# Patient Record
Sex: Male | Born: 1995 | Race: White | Hispanic: No | Marital: Single | Smoking: Never smoker
Health system: Southern US, Community
[De-identification: ages and names within clinical notes are randomized; demographics above are authoritative.]

## PROBLEM LIST (undated history)

## (undated) VITALS — BP 116/75 | HR 92 | Temp 99.5°F | Resp 18 | Ht 70.0 in | Wt 211.0 lb

## (undated) DIAGNOSIS — S0990XA Unspecified injury of head, initial encounter: Secondary | ICD-10-CM

## (undated) DIAGNOSIS — F69 Unspecified disorder of adult personality and behavior: Secondary | ICD-10-CM

## (undated) DIAGNOSIS — F99 Mental disorder, not otherwise specified: Secondary | ICD-10-CM

## (undated) DIAGNOSIS — A86 Unspecified viral encephalitis: Secondary | ICD-10-CM

## (undated) HISTORY — DX: Unspecified viral encephalitis: A86

## (undated) HISTORY — PX: FRACTURE SURGERY: SHX138

## (undated) HISTORY — DX: Unspecified injury of head, initial encounter: S09.90XA

---
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
## 2015-01-15 ENCOUNTER — Other Ambulatory Visit (HOSPITAL_COMMUNITY): Payer: Self-pay | Admitting: Sports Medicine

## 2015-01-15 DIAGNOSIS — M25521 Pain in right elbow: Secondary | ICD-10-CM

## 2015-01-26 ENCOUNTER — Ambulatory Visit (HOSPITAL_COMMUNITY)
Admission: RE | Admit: 2015-01-26 | Discharge: 2015-01-26 | Disposition: A | Discharge status: Home / Self Care | Payer: 59 | Source: Ambulatory Visit | Attending: Sports Medicine | Admitting: Sports Medicine

## 2015-01-26 DIAGNOSIS — M25521 Pain in right elbow: Secondary | ICD-10-CM

## 2015-01-26 DIAGNOSIS — M79601 Pain in right arm: Secondary | ICD-10-CM | POA: Diagnosis present

## 2015-10-21 ENCOUNTER — Telehealth: Payer: Self-pay | Admitting: Family Medicine

## 2015-10-21 DIAGNOSIS — R569 Unspecified convulsions: Secondary | ICD-10-CM

## 2015-10-21 NOTE — Telephone Encounter (Signed)
Ok to re-establish.  In the meantime, we'll send referral to Neuro for seizures

## 2015-10-21 NOTE — Telephone Encounter (Signed)
Notified Tammy that referral was sent to Neuro. They already have appt scheduled. She is driving and will call back to set up new pt/re-est with Dr. Birdie Riddle

## 2015-10-21 NOTE — Telephone Encounter (Signed)
Pt mom called stating he had a neuro episode yesterday and cannot go to Dr. Gaynell Face (ped neuro) anymore. She called adult neuro and was told pt needs referral. Pt last seen 12/01/2011. Advised mom he would need re-est and have appt. Pt mom is a pt too and they are ok to go to [facility].

## 2015-10-21 NOTE — Telephone Encounter (Signed)
Please schedule pt with tabori and referral was placed today.

## 2015-11-04 ENCOUNTER — Ambulatory Visit (INDEPENDENT_AMBULATORY_CARE_PROVIDER_SITE_OTHER): Payer: 59 | Admitting: Neurology

## 2015-11-04 ENCOUNTER — Encounter: Payer: Self-pay | Admitting: Neurology

## 2015-11-04 VITALS — BP 122/84 | HR 86 | Wt 204.0 lb

## 2015-11-04 DIAGNOSIS — R6889 Other general symptoms and signs: Secondary | ICD-10-CM

## 2015-11-04 DIAGNOSIS — R55 Syncope and collapse: Secondary | ICD-10-CM

## 2015-11-04 DIAGNOSIS — IMO0001 Reserved for inherently not codable concepts without codable children: Secondary | ICD-10-CM

## 2015-11-04 NOTE — Patient Instructions (Addendum)
1. Schedule MRI brain with and without contrast 2. Schedule 1-hour sleep deprived EEG 3. Follow-up in 1 month, call for any changes  You have been scheduled @ [facility] for MRI on 11/09/15.  Please arrive @ 2:00 pm.   [facility], [place] [address]      [phone]
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
## 2015-11-07 DIAGNOSIS — R55 Syncope and collapse: Secondary | ICD-10-CM | POA: Insufficient documentation

## 2015-11-07 DIAGNOSIS — IMO0001 Reserved for inherently not codable concepts without codable children: Secondary | ICD-10-CM | POA: Insufficient documentation

## 2015-11-07 DIAGNOSIS — R6889 Other general symptoms and signs: Secondary | ICD-10-CM

## 2015-11-09 DIAGNOSIS — R42 Dizziness and giddiness: Secondary | ICD-10-CM | POA: Diagnosis not present

## 2015-11-09 DIAGNOSIS — R2689 Other abnormalities of gait and mobility: Secondary | ICD-10-CM | POA: Diagnosis not present

## 2015-11-09 DIAGNOSIS — H538 Other visual disturbances: Secondary | ICD-10-CM | POA: Diagnosis not present

## 2015-11-11 ENCOUNTER — Telehealth: Payer: Self-pay | Admitting: Family Medicine

## 2015-11-11 NOTE — Telephone Encounter (Signed)
-----   Message from Cameron Sprang, MD sent at 11/11/2015 10:56 AM EST ----- Regarding: MRI brain results Pls let patient/mom know I reviewed MRI brain and it is normal, no evidence of tumor, stroke, or bleed. Thanks

## 2015-11-11 NOTE — Telephone Encounter (Signed)
Patient's mother/Tammy was notified of results.

## 2015-11-19 ENCOUNTER — Other Ambulatory Visit: Payer: 59

## 2015-12-03 ENCOUNTER — Ambulatory Visit (INDEPENDENT_AMBULATORY_CARE_PROVIDER_SITE_OTHER): Payer: 59 | Admitting: Neurology

## 2015-12-03 DIAGNOSIS — R55 Syncope and collapse: Secondary | ICD-10-CM | POA: Diagnosis not present

## 2015-12-09 ENCOUNTER — Ambulatory Visit (INDEPENDENT_AMBULATORY_CARE_PROVIDER_SITE_OTHER): Payer: 59 | Admitting: Neurology

## 2015-12-09 ENCOUNTER — Encounter: Payer: Self-pay | Admitting: Family Medicine

## 2015-12-09 ENCOUNTER — Ambulatory Visit (INDEPENDENT_AMBULATORY_CARE_PROVIDER_SITE_OTHER): Payer: 59 | Admitting: Family Medicine

## 2015-12-09 ENCOUNTER — Encounter: Payer: Self-pay | Admitting: Neurology

## 2015-12-09 VITALS — BP 114/82 | HR 79 | Wt 203.0 lb

## 2015-12-09 VITALS — BP 118/78 | HR 70 | Temp 97.8°F | Ht 70.0 in | Wt 203.6 lb

## 2015-12-09 DIAGNOSIS — Z0000 Encounter for general adult medical examination without abnormal findings: Secondary | ICD-10-CM | POA: Diagnosis not present

## 2015-12-09 DIAGNOSIS — R55 Syncope and collapse: Secondary | ICD-10-CM | POA: Diagnosis not present

## 2015-12-09 DIAGNOSIS — IMO0001 Reserved for inherently not codable concepts without codable children: Secondary | ICD-10-CM

## 2015-12-09 DIAGNOSIS — R6889 Other general symptoms and signs: Secondary | ICD-10-CM | POA: Diagnosis not present

## 2015-12-09 NOTE — Assessment & Plan Note (Signed)
Pt's PE WNL.  Pt declined flu shot.  Check labs.  Anticipatory guidance provided.

## 2015-12-09 NOTE — Patient Instructions (Signed)
Follow up in 1 year or as needed We'll notify you of your lab results and make any changes if needed Continue to work on healthy diet and regular exercise Call with any questions or concerns If you want to join [place] at the new [place] office, any scheduled appointments will automatically transfer and we will see you at [address] [place] [address] [place], [place] [address] ([place]) Happy Early Rudene Anda!!!

## 2015-12-09 NOTE — Procedures (Signed)
ELECTROENCEPHALOGRAM REPORT  Date of Study: 12/03/2015  Patient's Name: Gregg Crawford MRN: [id number] Date of Birth: 01-May-1996  Referring Provider: Dr. Ellouise Newer  Clinical History: This is a 20 year old man with a history of recurrent episodes of feeling "out of it," blurred vision, inability to function with near-syncopal sensation. After the episodes he would be sluggish to respond but without confusion.  Medications: none  Technical Summary: A multichannel digital 1-hour EEG recording measured by the international 10-20 system with electrodes applied with paste and impedances below 5000 ohms performed in our laboratory with EKG monitoring in an awake and asleep patient.  Hyperventilation and photic stimulation were performed.  The digital EEG was referentially recorded, reformatted, and digitally filtered in a variety of bipolar and referential montages for optimal display.    Description: The patient is awake and asleep during the recording.  During maximal wakefulness, there is a symmetric, medium voltage 11 Hz posterior dominant rhythm that attenuates with eye opening.  The record is symmetric.  During drowsiness and sleep, there is an increase in theta slowing of the background.  Vertex waves and symmetric sleep spindles were seen.  Hyperventilation and photic stimulation did not elicit any abnormalities.  There were no epileptiform discharges or electrographic seizures seen.    EKG lead was unremarkable.  Impression: This 1-hour awake and asleep EEG is normal.    Clinical Correlation: A normal EEG does not exclude a clinical diagnosis of epilepsy.  If further clinical questions remain, prolonged EEG may be helpful.  Clinical correlation is advised.   Ellouise Newer, M.D.

## 2015-12-09 NOTE — Progress Notes (Signed)
   Subjective:    Patient ID: Gregg Crawford, male    DOB: 23-Dec-1995, 20 y.o.   MRN: [id number]  HPI CPE- currently having a neuro w/u for near syncope.  No other concerns today.   Review of Systems Patient reports no vision/hearing changes, anorexia, fever, adenopathy, persistant/recurrent hoarseness, swallowing issues, chest pain, palpitations, edema, persistant/recurrent cough, hemoptysis, dyspnea (rest,exertional, paroxysmal nocturnal), gastrointestinal  bleeding (melena, rectal bleeding), abdominal pain, excessive heart burn, GU symptoms (dysuria, hematuria, voiding/incontinence issues), focal weakness, memory loss, numbness & tingling, skin/hair/nail changes, depression, anxiety, abnormal bruising/bleeding, musculoskeletal symptoms/signs.     Objective:   Physical Exam General Appearance:    Alert, cooperative, no distress, appears stated age  Head:    Normocephalic, without obvious abnormality, atraumatic  Eyes:    PERRL, conjunctiva/corneas clear, EOM's intact, fundi    benign, both eyes       Ears:    Normal TM's and external ear canals, both ears  Nose:   Nares normal, septum midline, mucosa normal, no drainage   or sinus tenderness  Throat:   Lips, mucosa, and tongue normal; teeth and gums normal  Neck:   Supple, symmetrical, trachea midline, no adenopathy;       thyroid:  No enlargement/tenderness/nodules  Back:     Symmetric, no curvature, ROM normal, no CVA tenderness  Lungs:     Clear to auscultation bilaterally, respirations unlabored  Chest wall:    No tenderness or deformity  Heart:    Regular rate and rhythm, S1 and S2 normal, no murmur, rub   or gallop  Abdomen:     Soft, non-tender, bowel sounds active all four quadrants,    no masses, no organomegaly  Genitalia:    Normal male without lesion, masses,discharge or tenderness  Rectal:    Deferred due to young age  Extremities:   Extremities normal, atraumatic, no cyanosis or edema  Pulses:   2+ and symmetric all  extremities  Skin:   Skin color, texture, turgor normal, no rashes or lesions  Lymph nodes:   Cervical, supraclavicular, and axillary nodes normal  Neurologic:   CNII-XII intact. Normal strength, sensation and reflexes      throughout          Assessment & Plan:

## 2015-12-09 NOTE — Patient Instructions (Signed)
1. Schedule 30-day holter monitor 2. If holter monitor normal, we will consider a 24-hour EEG 3. If you lose consciousness, stop driving until 6 months event-free 4. Follow-up in 2 months, call for any changes
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
## 2015-12-10 LAB — CBC WITH DIFFERENTIAL/PLATELET
BASOS PCT: 0.4 % (ref 0.0–3.0)
Basophils Absolute: 0 10*3/uL (ref 0.0–0.1)
EOS PCT: 1.2 % (ref 0.0–5.0)
Eosinophils Absolute: 0.1 10*3/uL (ref 0.0–0.7)
HCT: 49.1 % (ref 39.0–52.0)
Hemoglobin: 16.3 g/dL (ref 13.0–17.0)
LYMPHS ABS: 2.6 10*3/uL (ref 0.7–4.0)
Lymphocytes Relative: 41.8 % (ref 12.0–46.0)
MCHC: 33.2 g/dL (ref 30.0–36.0)
MCV: 91.7 fl (ref 78.0–100.0)
MONOS PCT: 8.7 % (ref 3.0–12.0)
Monocytes Absolute: 0.5 10*3/uL (ref 0.1–1.0)
NEUTROS ABS: 3 10*3/uL (ref 1.4–7.7)
NEUTROS PCT: 47.9 % (ref 43.0–77.0)
PLATELETS: 255 10*3/uL (ref 150.0–400.0)
RBC: 5.36 Mil/uL (ref 4.22–5.81)
RDW: 11.7 % (ref 11.5–14.6)
WBC: 6.3 10*3/uL (ref 4.5–10.5)

## 2015-12-10 LAB — TSH: TSH: 0.95 u[IU]/mL (ref 0.35–5.50)

## 2015-12-10 LAB — BASIC METABOLIC PANEL
BUN: 11 mg/dL (ref 6–23)
CO2: 30 mEq/L (ref 19–32)
Calcium: 9.8 mg/dL (ref 8.4–10.5)
Chloride: 103 mEq/L (ref 96–112)
Creatinine, Ser: 1.14 mg/dL (ref 0.40–1.50)
GFR: 86.19 mL/min (ref >60.00)
GLUCOSE: 72 mg/dL (ref 70–99)
POTASSIUM: 4.3 meq/L (ref 3.5–5.1)
SODIUM: 139 meq/L (ref 135–145)

## 2015-12-10 LAB — HEPATIC FUNCTION PANEL
ALK PHOS: 76 U/L (ref 39–117)
ALT: 20 U/L (ref 0–53)
AST: 22 U/L (ref 0–37)
Albumin: 4.7 g/dL (ref 3.5–5.2)
BILIRUBIN DIRECT: 0.1 mg/dL (ref 0.0–0.3)
Total Bilirubin: 0.6 mg/dL (ref 0.2–1.2)
Total Protein: 7.4 g/dL (ref 6.0–8.3)

## 2015-12-10 LAB — LIPID PANEL
CHOL/HDL RATIO: 3
CHOLESTEROL: 158 mg/dL (ref 0–200)
HDL: 47 mg/dL (ref >39.00)
LDL CALC: 85 mg/dL (ref 0–99)
NonHDL: 110.91
TRIGLYCERIDES: 129 mg/dL (ref 0.0–149.0)
VLDL: 25.8 mg/dL (ref 0.0–40.0)

## 2015-12-11 ENCOUNTER — Encounter: Payer: Self-pay | Admitting: General Practice

## 2015-12-16 ENCOUNTER — Ambulatory Visit: Payer: 59

## 2015-12-16 ENCOUNTER — Encounter: Payer: Self-pay | Admitting: *Deleted

## 2015-12-16 NOTE — Progress Notes (Signed)
Patient ID: Gregg Crawford, male   DOB: 07-17-1996, 20 y.o.   MRN: [id number] Patient enrolled for [employer] to mail a 30 day cardiac event monitor to the home.  We are unable to apply our standard [employer] monitor to patients less than or equal to 58 years of age.  Patients mother requests  [employer] to mail the monitor to her home in order to assure someone will be there to receive it.

## 2015-12-17 ENCOUNTER — Ambulatory Visit (INDEPENDENT_AMBULATORY_CARE_PROVIDER_SITE_OTHER): Payer: 59

## 2015-12-17 DIAGNOSIS — R55 Syncope and collapse: Secondary | ICD-10-CM

## 2015-12-17 DIAGNOSIS — R6889 Other general symptoms and signs: Secondary | ICD-10-CM

## 2016-01-27 ENCOUNTER — Telehealth: Payer: Self-pay | Admitting: Family Medicine

## 2016-01-27 NOTE — Telephone Encounter (Signed)
-----   Message from Cameron Sprang, MD sent at 01/27/2016  1:20 PM EDT ----- Pls let patient/mother know that heart monitor was normal. Thanks

## 2016-01-27 NOTE — Telephone Encounter (Signed)
Called patient. Spoke with patients mother/Gregg Crawford notified her of result. We will cancel patients appt for f/u next week since nothing has changed with patients sxs/no new sxs & work up has been normal. They will call for an appt if anything changes.

## 2016-02-04 ENCOUNTER — Ambulatory Visit: Payer: 59 | Admitting: Neurology

## 2016-02-05 ENCOUNTER — Ambulatory Visit: Payer: 59 | Admitting: Neurology

## 2017-01-06 DIAGNOSIS — S60452A Superficial foreign body of right middle finger, initial encounter: Secondary | ICD-10-CM | POA: Diagnosis not present

## 2017-01-12 DIAGNOSIS — D229 Melanocytic nevi, unspecified: Secondary | ICD-10-CM | POA: Diagnosis not present

## 2017-01-12 DIAGNOSIS — L709 Acne, unspecified: Secondary | ICD-10-CM | POA: Diagnosis not present

## 2017-01-12 DIAGNOSIS — D179 Benign lipomatous neoplasm, unspecified: Secondary | ICD-10-CM | POA: Diagnosis not present

## 2017-04-12 ENCOUNTER — Other Ambulatory Visit (HOSPITAL_COMMUNITY)
Admission: RE | Admit: 2017-04-12 | Discharge: 2017-04-12 | Disposition: A | Discharge status: Home / Self Care | Payer: 59 | Source: Ambulatory Visit | Attending: Family Medicine | Admitting: Family Medicine

## 2017-04-12 ENCOUNTER — Encounter: Payer: Self-pay | Admitting: Family Medicine

## 2017-04-12 ENCOUNTER — Ambulatory Visit (INDEPENDENT_AMBULATORY_CARE_PROVIDER_SITE_OTHER): Payer: 59 | Admitting: Family Medicine

## 2017-04-12 ENCOUNTER — Encounter: Payer: Self-pay | Admitting: General Practice

## 2017-04-12 VITALS — BP 121/81 | HR 90 | Temp 98.0°F | Resp 17 | Ht 70.0 in | Wt 192.2 lb

## 2017-04-12 DIAGNOSIS — Z23 Encounter for immunization: Secondary | ICD-10-CM

## 2017-04-12 DIAGNOSIS — Z0000 Encounter for general adult medical examination without abnormal findings: Secondary | ICD-10-CM

## 2017-04-12 DIAGNOSIS — Z202 Contact with and (suspected) exposure to infections with a predominantly sexual mode of transmission: Secondary | ICD-10-CM | POA: Insufficient documentation

## 2017-04-12 DIAGNOSIS — H539 Unspecified visual disturbance: Secondary | ICD-10-CM | POA: Diagnosis not present

## 2017-04-12 NOTE — Addendum Note (Signed)
Addended by: Katina Dung on: 04/12/2017 08:34 AM   Modules accepted: Orders

## 2017-04-12 NOTE — Progress Notes (Signed)
   Subjective:    Patient ID: Gregg Crawford, male    DOB: [birth year], 21 y.o.   MRN: [id number]  HPI CPE- pt's wants STD testing due to fact that girlfriend cheating.  Due for Tdap.  Pt is down 10 lbs since last year.  Exercising regularly.     Review of Systems Patient reports no vision/hearing changes, anorexia, fever ,adenopathy, persistant/recurrent hoarseness, swallowing issues, chest pain, palpitations, edema, persistant/recurrent cough, hemoptysis, dyspnea (rest,exertional, paroxysmal nocturnal), gastrointestinal  bleeding (melena, rectal bleeding), abdominal pain, excessive heart burn, GU symptoms (dysuria, hematuria, voiding/incontinence issues) syncope, focal weakness, memory loss, numbness & tingling, skin/hair/nail changes, depression, anxiety, abnormal bruising/bleeding, musculoskeletal symptoms/signs.   + visual changes in L eye    Objective:   Physical Exam General Appearance:    Alert, cooperative, no distress, appears stated age  Head:    Normocephalic, without obvious abnormality, atraumatic  Eyes:    PERRL, conjunctiva/corneas clear, EOM's intact, fundi    benign, both eyes       Ears:    Normal TM's and external ear canals, both ears  Nose:   Nares normal, septum midline, mucosa normal, no drainage   or sinus tenderness  Throat:   Lips, mucosa, and tongue normal; teeth and gums normal  Neck:   Supple, symmetrical, trachea midline, no adenopathy;       thyroid:  No enlargement/tenderness/nodules  Back:     Symmetric, no curvature, ROM normal, no CVA tenderness  Lungs:     Clear to auscultation bilaterally, respirations unlabored  Chest wall:    No tenderness or deformity  Heart:    Regular rate and rhythm, S1 and S2 normal, no murmur, rub   or gallop  Abdomen:     Soft, non-tender, bowel sounds active all four quadrants,    no masses, no organomegaly  Genitalia:    Normal male without lesion, masses,discharge or tenderness  Rectal:    Deferred due to young age    Extremities:   Extremities normal, atraumatic, no cyanosis or edema  Pulses:   2+ and symmetric all extremities  Skin:   Skin color, texture, turgor normal, no rashes or lesions  Lymph nodes:   Cervical, supraclavicular, and axillary nodes normal  Neurologic:   CNII-XII intact. Normal strength, sensation and reflexes      throughout          Assessment & Plan:

## 2017-04-12 NOTE — Patient Instructions (Signed)
Follow up in 1 year or as needed We'll notify you of your lab results and make any changes if needed Continue to work on healthy diet and regular exercise- you look great! Call with any questions or concerns Have a great summer!! 

## 2017-04-12 NOTE — Assessment & Plan Note (Signed)
Pt's PE WNL.  Tdap given today.  Check labs today to r/o possible STD infxn.  Anticipatory guidance provided.

## 2017-04-12 NOTE — Addendum Note (Signed)
Addended by: Davis Gourd on: 04/12/2017 08:47 AM   Modules accepted: Orders

## 2017-04-12 NOTE — Progress Notes (Signed)
Pre visit review using our clinic review tool, if applicable. No additional management support is needed unless otherwise documented below in the visit note. 

## 2017-04-13 LAB — URINE CYTOLOGY ANCILLARY ONLY
CHLAMYDIA, DNA PROBE: NEGATIVE
NEISSERIA GONORRHEA: NEGATIVE

## 2017-04-13 LAB — HIV ANTIBODY (ROUTINE TESTING W REFLEX): HIV 1&2 Ab, 4th Generation: NONREACTIVE

## 2017-04-13 LAB — RPR

## 2017-04-17 NOTE — Progress Notes (Signed)
Called pt and lmovm to return call.

## 2017-04-18 LAB — HSV(HERPES SMPLX)ABS-I+II(IGG+IGM)-BLD
HSV 1 Glycoprotein G Ab, IgG: <0.91 index (ref 0.00–0.90)
HSV 2 Glycoprotein G Ab, IgG: <0.91 index (ref 0.00–0.90)
HSVI/II Comb IgM: 1.09 Ratio — ABNORMAL HIGH (ref 0.00–0.90)

## 2017-04-20 ENCOUNTER — Ambulatory Visit (INDEPENDENT_AMBULATORY_CARE_PROVIDER_SITE_OTHER): Payer: 59 | Admitting: Family Medicine

## 2017-04-20 ENCOUNTER — Encounter: Payer: Self-pay | Admitting: Family Medicine

## 2017-04-20 VITALS — BP 121/81 | HR 76 | Temp 98.0°F | Resp 16 | Ht 70.0 in | Wt 193.4 lb

## 2017-04-20 DIAGNOSIS — M545 Low back pain, unspecified: Secondary | ICD-10-CM

## 2017-04-20 MED ORDER — CYCLOBENZAPRINE HCL 10 MG PO TABS: 10.0000 mg | ORAL_TABLET | Freq: Three times a day (TID) | ORAL | 0 refills | Status: DC | PRN

## 2017-04-20 MED ORDER — MELOXICAM 15 MG PO TABS: 15.0000 mg | ORAL_TABLET | Freq: Every day | ORAL | 0 refills | Status: DC

## 2017-04-20 NOTE — Progress Notes (Signed)
   Subjective:    Patient ID: Gregg Crawford, male    DOB: Apr 18, 1996, 21 y.o.   MRN: [id number]  HPI Back pain- sxs started ~3 weeks ago when he woke w/ L lower back pain.  sxs worsened starting 4 days ago.  Pain has now radiated to R side.  Painful to walk, sit, move.  Unable to exercise.  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  Pt has not tried any OTC tylenol/ibuprofen.  No radiation of pain into butt or thighs.  No change in sleeping arrangements.  Denies urinary frequency, urgency, hesitancy, hematuria   Review of Systems For ROS see HPI     Objective:   Physical Exam  Constitutional: He is oriented to person, place, and time. He appears well-developed and well-nourished. No distress.  HENT:  Head: Normocephalic and atraumatic.  Cardiovascular: Intact distal pulses.   Musculoskeletal: He exhibits tenderness (TTP over lumbar paraspinal muscles bilaterally). He exhibits no edema or deformity.  Pain w/ extension>flexion  Neurological: He is alert and oriented to person, place, and time. He has normal reflexes. No cranial nerve deficit. Coordination normal.  Skin: Skin is warm and dry. No rash noted. No erythema.  Psychiatric: He has a normal mood and affect. His behavior is normal. Thought content normal.  Vitals reviewed.         Assessment & Plan:  Lumbar back pain- new.  No red flags on PE or hx.  Start daily NSAID as pt has not tried any OTC meds.  Flexeril prn- cautioned that he is not able to use while working due to drowsiness.  Reviewed supportive care and red flags that should prompt return.  Pt expressed understanding and is in agreement w/ plan.

## 2017-04-20 NOTE — Progress Notes (Signed)
Pre visit review using our clinic review tool, if applicable. No additional management support is needed unless otherwise documented below in the visit note. 

## 2017-04-20 NOTE — Patient Instructions (Signed)
Follow up as needed This appears to be a lumbar strain and should improve w/ time and the medications Start the Meloxicam once daily- take w/ food- for pain and inflammation Use the Flexeril prior to bed or on weekends for stiffness/spasm- will cause drowsiness Heating pad for pain relief Call with any questions or concerns- particularly if no improvement in 7-10 days Hang in there!!!

## 2017-05-12 ENCOUNTER — Ambulatory Visit (HOSPITAL_COMMUNITY)
Admission: EM | Admit: 2017-05-12 | Discharge: 2017-05-12 | Disposition: A | Discharge status: Home / Self Care | Payer: 59 | Attending: Emergency Medicine | Admitting: Emergency Medicine

## 2017-05-12 ENCOUNTER — Encounter (HOSPITAL_COMMUNITY): Payer: Self-pay

## 2017-05-12 DIAGNOSIS — B009 Herpesviral infection, unspecified: Secondary | ICD-10-CM | POA: Diagnosis not present

## 2017-05-12 DIAGNOSIS — Z8249 Family history of ischemic heart disease and other diseases of the circulatory system: Secondary | ICD-10-CM | POA: Diagnosis not present

## 2017-05-12 MED ORDER — VALACYCLOVIR HCL 1 G PO TABS: 1000.0000 mg | ORAL_TABLET | Freq: Two times a day (BID) | ORAL | 0 refills | Status: AC

## 2017-05-12 NOTE — Discharge Instructions (Signed)
The duration of treatments for the initial outbreak is 7, not 5 days. Take 1 tab Valtrex twice a day for 7 days. If you have another breakout, take 1 tablet of Valtrex once a day for 5 days. I have given you enough for the initial episode and for 2 recurrent outbreaks. We will call you if your labs come back positive, and you and your partner will need to be treated if this is the case.

## 2017-05-12 NOTE — ED Triage Notes (Signed)
Has been with the same girl for 1 year and said he knows his gf has herpes and said he has using protection with her "most of the time" and now having a swollen lymph node in his groin on the right side, has pustule on his penis. 1 big one on the tip and the rest are on the foreskin of the penis. Looks like "poison ivy" none reported on the sac. The pustules are draining a clear fluid and said it does itch and a slight burn. Michela Pitcher he is unsure of dysuria because he freaked out. No fever.

## 2017-05-12 NOTE — ED Provider Notes (Signed)
HPI  SUBJECTIVE:  Gregg Crawford is a 21 y.o. male who presents with possible genital herpes. Patient states that he is in a year-long monogamous relationship with a male who has herpes, and they have been using condoms intermittently. She is currently asymptomatic. He reports swollen painful right-sided inguinal lymphadenopathy starting 3-4 days ago and noticed a bump on his penis 2 days ago. This morning he reports a blistery rash with watery drainage. He reports itching. There is no burning, pain, proceeding paresthesias. No penile discharge. No nausea, vomiting, fevers, body aches, flulike symptoms, sore throat. No testicular pain, scrotal pain or swelling, pelvic pain. There are no aggravating or alleviating factors. He has not tried anything for this. Past medical history noted for gonorrhea, chlamydia, herpes, HIV, syphilis, trichomonas. States that he had full STD screening including RPR and HIV last month which came back negative. GYJ:EHUDJS, Aundra Millet, MD     Past Medical History:  Diagnosis Date  . Head injury 05/2010   reacurrent sxs dizziness, headaches,confusion had a negative MRI and EEG  . Viral encephalitis     Past Surgical History:  Procedure Laterality Date  . FRACTURE SURGERY     fracture bilateral ankles    Family History  Problem Relation Age of Onset  . Hypertension Father   . Diabetes Maternal Grandfather   . Early death Maternal Grandfather   . Hypertension Maternal Grandfather   . Heart disease Maternal Grandfather   . Melanoma Mother     Social History  Substance Use Topics  . Smoking status: Never Smoker  . Smokeless tobacco: Current User    Types: Chew     Comment: dips  . Alcohol use 6.0 oz/week    10 Cans of beer per week     Comment: Occ    No current facility-administered medications for this encounter.   Current Outpatient Prescriptions:  .  cyclobenzaprine (FLEXERIL) 10 MG tablet, Take 1 tablet (10 mg total) by mouth 3 (three)  times daily as needed for muscle spasms., Disp: 45 tablet, Rfl: 0 .  meloxicam (MOBIC) 15 MG tablet, Take 1 tablet (15 mg total) by mouth daily., Disp: 30 tablet, Rfl: 0 .  valACYclovir (VALTREX) 1000 MG tablet, Take 1 tablet (1,000 mg total) by mouth 2 (two) times daily. 1 tab by mouth twice a day for 7 days. for recurrence take one pill once daily for 5 days., Disp: 24 tablet, Rfl: 0  No Known Allergies   ROS  As noted in HPI.   Physical Exam  BP (!) 118/56   Pulse 98   Temp 98.2 F (36.8 C) (Oral)   Resp 17   SpO2 100%   Constitutional: Well developed, well nourished, no acute distress Eyes:  EOMI, conjunctiva normal bilaterally HENT: Normocephalic, atraumatic,mucus membranes moist Respiratory: Normal inspiratory effort Cardiovascular: Normal rate GI: nondistended GU: Tender papule on the head of the penis, grouped vesicles underneath the glans. No other penile rash. No penile discharge. No testicular or epididymal swelling, tenderness. Patient declined chaperone Lymph: Positive tender right-sided inguinal lymphadenopathy. skin: No rash, skin intact Musculoskeletal: no deformities Neurologic: Alert & oriented x 3, no focal neuro deficits Psychiatric: Speech and behavior appropriate   ED Course   Medications - No data to display  No orders of the defined types were placed in this encounter.   No results found for this or any previous visit (from the past 24 hour(s)). No results found.  ED Clinical Impression  Herpes simplex infection  ED Assessment/Plan  Presentation consistent with primary herpes infection. Sending off gonorrhea Chlamydia, trichomonas but will withhold treatment pending labs. Did not send off HSV cultures because clinically this appears to be herpes. Discussed labs, MDM, plan and followup with patient .  Patient agrees with plan.   Meds ordered this encounter  Medications  . valACYclovir (VALTREX) 1000 MG tablet    Sig: Take 1 tablet  (1,000 mg total) by mouth 2 (two) times daily. 1 tab by mouth twice a day for 7 days. for recurrence take one pill once daily for 5 days.    Dispense:  24 tablet    Refill:  0    *This clinic note was created using [employer]. Therefore, there may be occasional mistakes despite careful proofreading.  ?   Melynda Ripple, MD 05/12/17 2205

## 2017-05-15 LAB — URINE CYTOLOGY ANCILLARY ONLY
Chlamydia: NEGATIVE
NEISSERIA GONORRHEA: NEGATIVE
TRICH (WINDOWPATH): NEGATIVE

## 2017-05-29 ENCOUNTER — Telehealth: Payer: Self-pay | Admitting: Family Medicine

## 2017-05-29 NOTE — Telephone Encounter (Signed)
Shouldn't pt have an appt to discuss? I feel odd having this conversation with a 21yo's mom

## 2017-05-29 NOTE — Telephone Encounter (Signed)
Mom states that pt was seen at [place] care [place] and is still having issues, mom would like a call back regarding this, she states that you can see the notes to know what he was seen for.

## 2017-05-29 NOTE — Telephone Encounter (Signed)
Pt mom wrote a Pharmacist, community message to PCP. Matters to be addressed there.

## 2017-05-29 NOTE — Telephone Encounter (Signed)
I do not feel comfortable discussing STIs w/ pt's mother.  If she has questions, she needs to ask him.  If he has questions, he needs to call the office himself to discuss or schedule an appt.

## 2017-05-30 ENCOUNTER — Encounter: Payer: Self-pay | Admitting: Physician Assistant

## 2017-05-30 ENCOUNTER — Ambulatory Visit (INDEPENDENT_AMBULATORY_CARE_PROVIDER_SITE_OTHER): Payer: 59 | Admitting: Physician Assistant

## 2017-05-30 ENCOUNTER — Ambulatory Visit: Payer: 59 | Admitting: Family Medicine

## 2017-05-30 VITALS — BP 116/60 | HR 102 | Temp 98.1°F | Resp 16 | Ht 70.0 in | Wt 202.1 lb

## 2017-05-30 DIAGNOSIS — A6002 Herpesviral infection of other male genital organs: Secondary | ICD-10-CM | POA: Diagnosis not present

## 2017-05-30 MED ORDER — VALACYCLOVIR HCL 500 MG PO TABS: 500.0000 mg | ORAL_TABLET | Freq: Every day | ORAL | 1 refills | Status: DC

## 2017-05-30 NOTE — Progress Notes (Signed)
Pre visit review using our clinic review tool, if applicable. No additional management support is needed unless otherwise documented below in the visit note. 

## 2017-05-30 NOTE — Patient Instructions (Signed)
Please finish the entire course of antiviral given by the Urgent Care/Emergency Department.   Once completed, please start the daily 500 mg dose of the Valtrex for preventive therapy. We will continue this for now until your body gains control of the virus and can suppress it on its own.  Recurrences are common short-term after first episode. This will improve!  Stress, poor diet and lack of sleep can also precipitate recurrences. Make sure you are staying well-hydrated, taking a daily multivitamin and eating a well-balanced diet low in processed foods. Get plenty of rest. Consider starting an L-Lysine and/or a folic acid supplement daily to help boost the immune system.  Follow-up 3-4 weeks with myself or Dr. Birdie Riddle.  If you note any recurrence on the preventive medication, call us at the first sign of this.

## 2017-05-30 NOTE — Progress Notes (Signed)
Patient presents to clinic today to discuss ongoing management of genital herpes. Patient was initially diagnosed with genital herpes after presenting to the ER on 05/12/17 with first outbreak. Patient with known exposure from now ex-girlfriend who he was with for > 1 year without symptoms. States they were very careful and used protection consistently. Patient was started on Valtrex 1000 mg BID x 7 days and given an Rx for Valtrex 1000 mg QD x 5 days to keep on hand in case of any recurrence. Patient endorses taking initial script as directed with complete resolution of symptoms. Then this weekend he noted a recurrence of vesicular lesions. Endorses starting the second script given for recurrence. Symptoms are improving. Denies fever, chills or malaise with recurrence. Did not these with initial outbreaks. Is concerned about the quick recurrence.   Past Medical History:  Diagnosis Date  . Head injury 05/2010   reacurrent sxs dizziness, headaches,confusion had a negative MRI and EEG  . Viral encephalitis     No current outpatient prescriptions on file prior to visit.   No current facility-administered medications on file prior to visit.     No Known Allergies  Family History  Problem Relation Age of Onset  . Hypertension Father   . Diabetes Maternal Grandfather   . Early death Maternal Grandfather   . Hypertension Maternal Grandfather   . Heart disease Maternal Grandfather   . Melanoma Mother     Social History   Social History  . Marital status: Single    Spouse name: N/A  . Number of children: N/A  . Years of education: N/A   Occupational History  . Autobody work    Social History Main Topics  . Smoking status: Never Smoker  . Smokeless tobacco: Current User    Types: Chew     Comment: dips  . Alcohol use 6.0 oz/week    10 Cans of beer per week     Comment: Occ  . Drug use: Yes    Frequency: 1.0 time per week    Types: Marijuana  . Sexual activity: Yes    Birth  control/ protection: Condom   Other Topics Concern  . None   Social History Narrative   Lives at home with father; stays with mother and sister on occasion; Takes supplement (C-4) prior to workouts      Dr. Gaynell Face for neuro   Dr. Christophe Louis PCP at [facility] on [address] - See HPI.  All other ROS are negative.  BP 116/60   Pulse (!) 102   Temp 98.1 F (36.7 C) (Oral)   Resp 16   Ht 5\' 10"  (1.778 m)   Wt 202 lb 2 oz (91.7 kg)   SpO2 98%   BMI 29.00 kg/m   Physical Exam  Constitutional: He is oriented to person, place, and time and well-developed, well-nourished, and in no distress.  HENT:  Head: Normocephalic and atraumatic.  Cardiovascular: Normal rate, regular rhythm, normal heart sounds and intact distal pulses.   Pulmonary/Chest: Effort normal and breath sounds normal. No respiratory distress. He has no wheezes. He has no rales. He exhibits no tenderness.  Genitourinary: Penis exhibits lesions.  Genitourinary Comments: Evidence of recent outbreak of vesicular lesions of ventral penile shaft just proximal to coronal sulcus. These lesions are now dry and crusted over, showing signs of routine healing. No noted evidence of secondary bacterial infection.   Neurological: He is alert and oriented to person, place, and time.  Skin: Skin is warm and dry.  Psychiatric: Affect normal.  Vitals reviewed.   Recent Results (from the past 2160 hour(s))  Urine cytology ancillary only     Status: None   Collection Time: 04/12/17 12:00 AM  Result Value Ref Range   Chlamydia Negative     Comment: Normal Reference Range - Negative   Neisseria gonorrhea Negative     Comment: Normal Reference Range - Negative  HIV antibody (with reflex)     Status: None   Collection Time: 04/12/17  8:34 AM  Result Value Ref Range   HIV 1&2 Ab, 4th Generation NONREACTIVE NONREACTIVE    Comment:   HIV-1 antigen and HIV-1/HIV-2 antibodies were not detected.  There is no laboratory  evidence of HIV infection.   HIV-1/2 Antibody Diff        Not indicated. HIV-1 RNA, Qual TMA          Not indicated.     PLEASE NOTE: This information has been disclosed to you from records whose confidentiality may be protected by state law. If your state requires such protection, then the state law prohibits you from making any further disclosure of the information without the specific written consent of the person to whom it pertains, or as otherwise permitted by law. A general authorization for the release of medical or other information is NOT sufficient for this purpose.   The performance of this assay has not been clinically validated in patients less than 37 years old.   For additional information please refer to http://education.[facility].com/faq/FAQ106.  (This link is being provided for informational/educational purposes only.)     RPR     Status: None   Collection Time: 04/12/17  8:34 AM  Result Value Ref Range   RPR Ser Ql NON REAC NON REAC  HSV(herpes smplx)abs-1+2(IgG+IgM)-bld     Status: Abnormal   Collection Time: 04/12/17  8:34 AM  Result Value Ref Range   HSVI/II Comb IgM 1.09 (H) 0.00 - 0.90 Ratio    Comment:                                  Negative        <0.91                                  Equivocal 0.91 - 1.09                                  Positive        >1.09    HSV 1 Glycoprotein G Ab, IgG <0.91 0.00 - 0.90 index    Comment:                                  Negative        <0.91                                  Equivocal 0.91 - 1.09                                  Positive        >  1.09  Note: Negative indicates no antibodies detected to  HSV-1. Equivocal may suggest early infection.  If  clinically appropriate, retest at later date. Positive  indicates antibodies detected to HSV-1.    HSV 2 Glycoprotein G Ab, IgG <0.91 0.00 - 0.90 index    Comment:                                  Negative        <0.91                                   Equivocal 0.91 - 1.09                                  Positive        >1.09  Note: Negative indicates no antibodies detected to  HSV-2. Equivocal may suggest early infection.  If  clinically appropriate, retest at later date. Positive  indicates antibodies detected to HSV-2.   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 Discussed triggers for herpes including stress, poor diet and poor sleep. He is to work on these. Also recommended start of L-Lysine supplement daily. Can also consider folic acid supplementation. Lesions today are healing. No active vesicles. Finish course of Valtrex for recurrence. Will start patient on 500 mg daily Valtrex for prevention in the short-term to help his body get a control on things. Follow-up scheduled.  - valACYclovir (VALTREX) 500 MG tablet; Take 1 tablet (500 mg total) by mouth daily.  Dispense: 30 tablet; Refill: 1   Leeanne Rio, [place]

## 2017-07-09 ENCOUNTER — Encounter (HOSPITAL_COMMUNITY): Payer: Self-pay | Admitting: Emergency Medicine

## 2017-07-09 ENCOUNTER — Ambulatory Visit (HOSPITAL_COMMUNITY)
Admission: EM | Admit: 2017-07-09 | Discharge: 2017-07-09 | Disposition: A | Discharge status: Home / Self Care | Payer: 59 | Attending: Family Medicine | Admitting: Family Medicine

## 2017-07-09 DIAGNOSIS — L03317 Cellulitis of buttock: Secondary | ICD-10-CM | POA: Diagnosis not present

## 2017-07-09 MED ORDER — DOXYCYCLINE HYCLATE 100 MG PO CAPS: 100.0000 mg | ORAL_CAPSULE | Freq: Two times a day (BID) | ORAL | 0 refills | Status: DC

## 2017-07-09 NOTE — ED Triage Notes (Signed)
Right leg swelling and pain since yesterday morning.  Symptoms have worsened.  Patient reports this was his last testosterone shot in a 10 week series

## 2017-07-09 NOTE — ED Provider Notes (Signed)
[facility]    CSN: [id number] Arrival date & time: 07/09/17  1333     History   Chief Complaint Chief Complaint  Patient presents with  . Leg Pain    HPI Gregg Crawford is a 21 y.o. male.   HPI  2 days ago, took testosterone injection, started to have swelling, redness and pain. Progressing and now he is having difficulty walking. He reports that he was sterile with the procedure. He is not having any fevers, drainage. No hx of MRSA.   Past Medical History:  Diagnosis Date  . Head injury 05/2010   reacurrent sxs dizziness, headaches,confusion had a negative MRI and EEG  . Viral encephalitis     Patient Active Problem List   Diagnosis Date Noted  . Physical exam 12/09/2015  . Spells 11/07/2015  . Near syncope 11/07/2015  . 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 Cough 12/01/2011  . Head injury, closed 12/01/2011  . Smokeless tobacco use 12/01/2011    Past Surgical History:  Procedure Laterality Date  . FRACTURE SURGERY     fracture bilateral ankles       Home Medications    Prior to Admission medications   Medication Sig Start Date End Date Taking? Authorizing Provider  ibuprofen (ADVIL,MOTRIN) 200 MG tablet Take 200 mg by mouth every 6 (six) hours as needed.   Yes [provider]  TESTOSTERONE IM Inject into the muscle.   Yes [provider]  doxycycline (VIBRAMYCIN) 100 MG capsule Take 1 capsule (100 mg total) by mouth 2 (two) times daily. 07/09/17   Shelda Pal, DO  valACYclovir (VALTREX) 500 MG tablet Take 1 tablet (500 mg total) by mouth daily. 05/30/17   Brunetta Jeans, PA-C    Family History Family History  Problem Relation Age of Onset  . Hypertension Father   . Diabetes Maternal Grandfather   . Early death Maternal Grandfather   . Hypertension Maternal Grandfather   . Heart disease Maternal Grandfather   . Melanoma Mother     Social History Social History  Substance Use Topics  . Smoking status:  Never Smoker  . Smokeless tobacco: Current User    Types: Chew     Comment: dips  . Alcohol use 6.0 oz/week    10 Cans of beer per week     Comment: Occ     Allergies   Patient has no known allergies.   Review of Systems Review of Systems  Constitutional: Negative for fever.  Skin: Positive for rash.     Physical Exam Triage Vital Signs ED Triage Vitals  Enc Vitals Group     BP 07/09/17 1514 129/76     Pulse Rate 07/09/17 1514 79     Resp 07/09/17 1514 20     Temp 07/09/17 1514 98 F (36.7 C)     Temp Source 07/09/17 1514 Oral     SpO2 07/09/17 1514 97 %     Pain Score 07/09/17 1515 10   Updated Vital Signs BP 129/76 (BP Location: Left Arm)   Pulse 79   Temp 98 F (36.7 C) (Oral)   Resp 20   SpO2 97%   Physical Exam  Constitutional: He is oriented to person, place, and time. He appears well-developed and well-nourished. No distress.  Pulmonary/Chest: Effort normal. No respiratory distress.  Musculoskeletal:  Antalgic gait  Neurological: He is alert and oriented to person, place, and time.  Skin:  R thigh/buttock area- postero-laterally there is a patch  of warmth and erythema, it is TTP, there is a central punctation where he injected himself. Edema appreciated. There is no crepitus, fluctuance, or active drainage appreciated.  Psychiatric: He has a normal mood and affect. Judgment normal.     UC Treatments / Results  Procedures Procedures none  Initial Impression / Assessment and Plan / UC Course  I have reviewed the triage vital signs and the nursing notes.  Pertinent labs & imaging results that were available during my care of the patient were reviewed by me and considered in my medical decision making (see chart for details).     Pt presents with apparent cellulitis of RLE. Will tx with abx. No apparent subcutaneous involvement or abscess formation at this time. No fevers or other suggestion of systemic involvement. Warning s/s's discussed with  patient. F/u with PCP if failure to improve. The patient voiced understanding and agreement to the plan.  Final Clinical Impressions(s) / UC Diagnoses   Final diagnoses:  Cellulitis of buttock    New Prescriptions Discharge Medication List as of 07/09/2017  3:47 PM    START taking these medications   Details  doxycycline (VIBRAMYCIN) 100 MG capsule Take 1 capsule (100 mg total) by mouth 2 (two) times daily., Starting Sun 07/09/2017, Normal         Controlled Substance Prescriptions [place] Controlled Substance Registry consulted? Not Applicable   Shelda Pal, [place] 07/09/17 1622

## 2017-07-09 NOTE — ED Notes (Signed)
Stressed importance of returning if feeling worse, spiking a high temp, increase redness and swelling

## 2017-07-11 ENCOUNTER — Emergency Department (HOSPITAL_COMMUNITY): Payer: 59

## 2017-07-11 ENCOUNTER — Observation Stay (HOSPITAL_COMMUNITY)
Admission: EM | Admit: 2017-07-11 | Discharge: 2017-07-13 | Disposition: A | Discharge status: Home / Self Care | Payer: 59 | Attending: Internal Medicine | Admitting: Internal Medicine

## 2017-07-11 ENCOUNTER — Encounter (HOSPITAL_COMMUNITY): Payer: Self-pay | Admitting: Emergency Medicine

## 2017-07-11 ENCOUNTER — Encounter: Payer: Self-pay | Admitting: Family Medicine

## 2017-07-11 ENCOUNTER — Ambulatory Visit (INDEPENDENT_AMBULATORY_CARE_PROVIDER_SITE_OTHER): Payer: 59 | Admitting: Family Medicine

## 2017-07-11 VITALS — BP 131/82 | HR 80 | Temp 98.8°F | Resp 16 | Ht 70.0 in | Wt 207.2 lb

## 2017-07-11 DIAGNOSIS — F553 Abuse of steroids or hormones: Secondary | ICD-10-CM | POA: Diagnosis not present

## 2017-07-11 DIAGNOSIS — M609 Myositis, unspecified: Secondary | ICD-10-CM | POA: Diagnosis not present

## 2017-07-11 DIAGNOSIS — L03317 Cellulitis of buttock: Principal | ICD-10-CM | POA: Diagnosis present

## 2017-07-11 DIAGNOSIS — L0390 Cellulitis, unspecified: Secondary | ICD-10-CM | POA: Diagnosis present

## 2017-07-11 DIAGNOSIS — Z833 Family history of diabetes mellitus: Secondary | ICD-10-CM | POA: Insufficient documentation

## 2017-07-11 DIAGNOSIS — K429 Umbilical hernia without obstruction or gangrene: Secondary | ICD-10-CM | POA: Diagnosis not present

## 2017-07-11 DIAGNOSIS — L0231 Cutaneous abscess of buttock: Secondary | ICD-10-CM | POA: Diagnosis not present

## 2017-07-11 DIAGNOSIS — Z8249 Family history of ischemic heart disease and other diseases of the circulatory system: Secondary | ICD-10-CM | POA: Insufficient documentation

## 2017-07-11 DIAGNOSIS — M7989 Other specified soft tissue disorders: Secondary | ICD-10-CM | POA: Diagnosis not present

## 2017-07-11 DIAGNOSIS — Z720 Tobacco use: Secondary | ICD-10-CM | POA: Diagnosis not present

## 2017-07-11 LAB — CBC WITH DIFFERENTIAL/PLATELET
Basophils Absolute: 0.1 10*3/uL (ref 0.0–0.1)
Basophils Relative: 0 %
EOS PCT: 1 %
Eosinophils Absolute: 0.1 10*3/uL (ref 0.0–0.7)
HCT: 46.9 % (ref 39.0–52.0)
Hemoglobin: 16 g/dL (ref 13.0–17.0)
LYMPHS ABS: 2.3 10*3/uL (ref 0.7–4.0)
Lymphocytes Relative: 18 %
MCH: 31.6 pg (ref 26.0–34.0)
MCHC: 34.1 g/dL (ref 30.0–36.0)
MCV: 92.7 fL (ref 78.0–100.0)
MONO ABS: 0.9 10*3/uL (ref 0.1–1.0)
Monocytes Relative: 7 %
Neutro Abs: 9.3 10*3/uL — ABNORMAL HIGH (ref 1.7–7.7)
Neutrophils Relative %: 74 %
Platelets: 312 10*3/uL (ref 150–400)
RBC: 5.06 MIL/uL (ref 4.22–5.81)
RDW: 12.9 % (ref 11.5–15.5)
WBC: 12.6 10*3/uL — ABNORMAL HIGH (ref 4.0–10.5)

## 2017-07-11 LAB — BASIC METABOLIC PANEL
ANION GAP: 10 (ref 5–15)
BUN: 9 mg/dL (ref 6–20)
CHLORIDE: 102 mmol/L (ref 101–111)
CO2: 27 mmol/L (ref 22–32)
Calcium: 9.2 mg/dL (ref 8.9–10.3)
Creatinine, Ser: 1.16 mg/dL (ref 0.61–1.24)
GFR calc Af Amer: >60 mL/min (ref >60)
GFR calc non Af Amer: >60 mL/min (ref >60)
GLUCOSE: 95 mg/dL (ref 65–99)
POTASSIUM: 3.7 mmol/L (ref 3.5–5.1)
Sodium: 139 mmol/L (ref 135–145)

## 2017-07-11 MED ORDER — TRAMADOL HCL 50 MG PO TABS
50.0000 mg | ORAL_TABLET | Freq: Four times a day (QID) | ORAL | Status: DC | PRN
  Administered 2017-07-12 (×2): 50 mg via ORAL
  Filled 2017-07-11 (×2): qty 1

## 2017-07-11 MED ORDER — ENOXAPARIN SODIUM 40 MG/0.4ML ~~LOC~~ SOLN
40.0000 mg | SUBCUTANEOUS | Status: DC
  Administered 2017-07-11 – 2017-07-12 (×2): 40 mg via SUBCUTANEOUS
  Filled 2017-07-11: qty 0.4

## 2017-07-11 MED ORDER — ONDANSETRON HCL 4 MG/2ML IJ SOLN
4.0000 mg | Freq: Once | INTRAMUSCULAR | Status: AC
  Administered 2017-07-11: 4 mg via INTRAVENOUS
  Filled 2017-07-11: qty 2

## 2017-07-11 MED ORDER — CEFAZOLIN SODIUM-DEXTROSE 2-4 GM/100ML-% IV SOLN
2.0000 g | Freq: Three times a day (TID) | INTRAVENOUS | Status: DC
  Administered 2017-07-11 – 2017-07-13 (×5): 2 g via INTRAVENOUS
  Filled 2017-07-11 (×8): qty 100

## 2017-07-11 MED ORDER — ONDANSETRON HCL 4 MG/2ML IJ SOLN
4.0000 mg | Freq: Four times a day (QID) | INTRAMUSCULAR | Status: DC | PRN
  Administered 2017-07-11 – 2017-07-12 (×2): 4 mg via INTRAVENOUS
  Filled 2017-07-11 (×2): qty 2

## 2017-07-11 MED ORDER — VANCOMYCIN HCL IN DEXTROSE 1-5 GM/200ML-% IV SOLN
1000.0000 mg | Freq: Once | INTRAVENOUS | Status: AC
  Administered 2017-07-11: 1000 mg via INTRAVENOUS
  Filled 2017-07-11: qty 200

## 2017-07-11 MED ORDER — ACETAMINOPHEN 325 MG PO TABS: 650.0000 mg | ORAL_TABLET | Freq: Four times a day (QID) | ORAL | Status: DC | PRN

## 2017-07-11 MED ORDER — SODIUM CHLORIDE 0.9 % IV BOLUS (SEPSIS)
1000.0000 mL | Freq: Once | INTRAVENOUS | Status: AC
  Administered 2017-07-11: 1000 mL via INTRAVENOUS

## 2017-07-11 MED ORDER — IOPAMIDOL (ISOVUE-300) INJECTION 61%
INTRAVENOUS | Status: AC
  Filled 2017-07-11: qty 100

## 2017-07-11 MED ORDER — ONDANSETRON HCL 4 MG PO TABS: 4.0000 mg | ORAL_TABLET | Freq: Four times a day (QID) | ORAL | Status: DC | PRN

## 2017-07-11 MED ORDER — IOPAMIDOL (ISOVUE-300) INJECTION 61%
100.0000 mL | Freq: Once | INTRAVENOUS | Status: AC | PRN
  Administered 2017-07-11: 100 mL via INTRAVENOUS

## 2017-07-11 MED ORDER — MORPHINE SULFATE (PF) 4 MG/ML IV SOLN
4.0000 mg | Freq: Once | INTRAVENOUS | Status: AC
  Administered 2017-07-11: 4 mg via INTRAVENOUS
  Filled 2017-07-11: qty 1

## 2017-07-11 MED ORDER — HYDROCODONE-ACETAMINOPHEN 5-325 MG PO TABS
2.0000 | ORAL_TABLET | Freq: Once | ORAL | Status: AC
  Administered 2017-07-11: 2 via ORAL
  Filled 2017-07-11: qty 2

## 2017-07-11 MED ORDER — SODIUM CHLORIDE 0.9 % IV SOLN
INTRAVENOUS | Status: DC
  Administered 2017-07-11: 22:00:00 via INTRAVENOUS

## 2017-07-11 MED ORDER — KETOROLAC TROMETHAMINE 15 MG/ML IJ SOLN
15.0000 mg | Freq: Four times a day (QID) | INTRAMUSCULAR | Status: DC | PRN
  Administered 2017-07-12: 15 mg via INTRAVENOUS
  Filled 2017-07-11: qty 1

## 2017-07-11 MED ORDER — IBUPROFEN 200 MG PO TABS
400.0000 mg | ORAL_TABLET | Freq: Once | ORAL | Status: AC
  Administered 2017-07-11: 400 mg via ORAL
  Filled 2017-07-11: qty 2

## 2017-07-11 MED ORDER — CEFAZOLIN SODIUM-DEXTROSE 2-4 GM/100ML-% IV SOLN
2.0000 g | Freq: Once | INTRAVENOUS | Status: AC
  Administered 2017-07-11: 2 g via INTRAVENOUS
  Filled 2017-07-11: qty 100

## 2017-07-11 MED ORDER — ACETAMINOPHEN 650 MG RE SUPP: 650.0000 mg | Freq: Four times a day (QID) | RECTAL | Status: DC | PRN

## 2017-07-11 NOTE — Progress Notes (Signed)
Pre visit review using our clinic review tool, if applicable. No additional management support is needed unless otherwise documented below in the visit note. 

## 2017-07-11 NOTE — H&P (Signed)
History and Physical  TOAN MORT CWC:[id number] DOB: 29-Dec-1995 DOA: 07/11/2017  Referring physician: Lajean Saver, ER physician  PCP: Midge Minium, MD  Outpatient Specialists: None Patient coming from: Home & is able to ambulate without assistance  Chief Complaint: Right buttock swelling, tenderness and erythema   HPI: Gregg Crawford is a 21 y.o. 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 Symptoms persisted so he went to urgent care yesterday, 9/10. He was given doxycycline and took this however symptoms persisted. In fact today, 9/11, redness was worse and so he went to go see his PCP. PCP found him to have cellulitis of the right buttock and given failure of outpatient by mouth antibiotics, refer him to the emergency room.  ED Course: In the emergency room, patient had a mild white count of 12.3. Given large list of area of infection, there was concern for abscess. Patient underwent a CT of the lower extremity which noted cellulitis of the right gluteus maximus muscle and a mild hypodensity which felt to be reactive versus secondary to mild myositis. No focal fluid collection was noted. Patient was given a dose of IV Ancef and IV vancomycin and hospitalists were called for further evaluation.   Unrelated, patient started noticing complaints of a mild umbilical hernia about 2-3 weeks ago. He says that it is easily reducible, but the moment he does anything active, it comes back out. It has never gotten stuck  Review of Systems: Patient seen after arrival to floor . Pt complains of right buttock pain, Tenderness and swelling. Pain without pain medication described as 8/10. Nonradiating. Located on the outer aspect of his right buttock going down the lateral aspect of his right from.   Pt denies any Headaches,  vision changes, dysphagia, chest pain, palpitations, shortness of breath, wheeze, cough, abdominal pain, hematuria, dysuria, constipation, diarrhea, focal extremity numbness weakness or pain other than described above  .  Review of systems are otherwise negative   Past Medical History:  Diagnosis Date  . Head injury 05/2010   reacurrent sxs dizziness, headaches,confusion had a negative MRI and EEG  . Viral encephalitis    Past Surgical History:  Procedure Laterality Date  . FRACTURE SURGERY     fracture bilateral ankles    Social History:  reports that he has never smoked. His smokeless tobacco use includes Chew. He reports that he drinks about 6.0 oz of alcohol per week . He reports that he uses drugs, including Marijuana, about 1 time per week.  Lives at home with his mom. Ambulates without assistance   No Known Allergies  Family History  Problem Relation Age of Onset  . Hypertension Father   . Diabetes Maternal Grandfather   . Early death Maternal Grandfather   . Hypertension Maternal Grandfather   . Heart disease Maternal Grandfather   . Melanoma Mother       Prior to Admission medications   Medication Sig Start Date End Date Taking? Authorizing Provider  doxycycline (VIBRAMYCIN) 100 MG capsule Take 1 capsule (100 mg total) by mouth 2 (two) times daily. 07/09/17  Yes Shelda Pal, DO  ibuprofen (ADVIL,MOTRIN) 200 MG tablet Take 800 mg by mouth every 6 (six) hours as needed for moderate pain.    Yes [provider]  valACYclovir (VALTREX) 500 MG tablet Take 1 tablet (500 mg total) by mouth  daily. 05/30/17  Yes Brunetta Jeans, PA-C    Physical Exam: BP (!) 120/58 (BP Location: Left Arm)   Pulse 67   Temp 98.1 F (36.7 C) (Oral)   Resp 16   Ht 5\' 10"  (1.778 m)   Wt 94.1 kg (207 lb 7.3 oz)   SpO2 97%   BMI 29.77 kg/m   General:  Alert and oriented 3, no acute distress   Eyes: Sclera nonicteric, extraocular movements are intact   ENT:  Normocephalic, atraumatic, mucous members are moist   Neck: Supple, no JVD   Cardiovascular: Regular rate and rhythm, S1-S2   Respiratory: Clear to auscultation bilaterally   Abdomen: Soft, nontender, nondistended, positive bowel sounds. Umbilical Hernia is palpable and easily reducible. Patient stands upright, hernia recurs  Skin: No skin breaks, tears or lesions  Musculoskeletal: no clubbing or cyanosis or edema  Psychiatric:  Patient is appropriate, no evidence of psychoses Neurologic: No focal deficits            Labs on Admission:  Basic Metabolic Panel:  Recent Labs Lab 07/11/17 1210  NA 139  K 3.7  CL 102  CO2 27  GLUCOSE 95  BUN 9  CREATININE 1.16  CALCIUM 9.2   Liver Function Tests: No results for input(s): AST, ALT, ALKPHOS, BILITOT, PROT, ALBUMIN in the last 168 hours. No results for input(s): LIPASE, AMYLASE in the last 168 hours. No results for input(s): AMMONIA in the last 168 hours. CBC:  Recent Labs Lab 07/11/17 1210  WBC 12.6*  NEUTROABS 9.3*  HGB 16.0  HCT 46.9  MCV 92.7  PLT 312   Cardiac Enzymes: No results for input(s): CKTOTAL, CKMB, CKMBINDEX, TROPONINI in the last 168 hours.  BNP (last 3 results) No results for input(s): BNP in the last 8760 hours.  ProBNP (last 3 results) No results for input(s): PROBNP in the last 8760 hours.  CBG: No results for input(s): GLUCAP in the last 168 hours.  Radiological Exams on Admission: Ct Extremity Lower Right W Contrast  Result Date: 07/11/2017 CLINICAL DATA:  Swelling and redness in the right lateral buttock. EXAM: CT OF THE LOWER RIGHT EXTREMITY WITH CONTRAST TECHNIQUE: Multidetector CT imaging of the lower right extremity was performed according to the standard protocol following intravenous contrast administration. COMPARISON:  None. CONTRAST:  143mL ISOVUE-300 IOPAMIDOL (ISOVUE-300) INJECTION 61% FINDINGS: Bones/Joint/Cartilage No fracture or dislocation. Normal alignment. No joint effusion.  Ligaments Ligaments are suboptimally evaluated by CT. Muscles and Tendons No intramuscular fluid collection or hematoma. No muscle atrophy. Mild hypodensity in the periphery of the right gluteus maximus muscle deep to the inflammatory changes in the subcutaneous fat. Soft tissue No fluid collection or hematoma. No soft tissue mass. Hazy inflammatory changes in the subcutaneous fat overlying the right gluteus maximus muscle. IMPRESSION: 1. Hazy inflammatory changes in the subcutaneous fat overlying the right gluteus maximus muscle concerning for cellulitis. Adjacent to the area cellulitis, there is mild hypodensity in the right gluteus maximus muscle peripherally which may be reactive versus secondary to mild myositis. No focal fluid collection to suggest an abscess. Electronically Signed   By: Kathreen Devoid   On: 07/11/2017 14:00    EKG: Not done   Assessment/Plan Present on Admission: . Cellulitis of buttock: Failed outpatient therapy. Would classify this as nonpurulent, moderate therefore treat with IV Ancef. MRSA screen looks to be negative, if positive, would change to vancomycin. At this point, hopefully should resolve quickly. Marland Kitchen Umbilical hernia: Easily reducible, however recurs just as easily.  At this time, not in emergency, however I advised the patient that he needs to follow-up with general surgery as an outpatient soon after discharge. I also reported this to his mother.  . 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 He tells me he is going to stop and has learned his lesson  Occasional THC use: Noted  Tobacco use: Chewing tobacco only. Counseled.  Principal Problem:   Cellulitis of buttock Active Problems:   Umbilical hernia   Abuse of steroids or  hormones   DVT prophylaxis: Lovenox   Code Status: Full code   Family Communication: Mother updated by phone   Disposition Plan: Anticipate discharge in the next few days   Consults called: None   Admission status: Given expectation that he will be here fast to midnight requiring acute Hospital services, admitting his inpatient     Annita Brod MD [facility] Pager [phone]  If 7PM-7AM, please contact night-coverage [email or web address] Password [facility]  07/11/2017, 6:48 PM

## 2017-07-11 NOTE — ED Notes (Signed)
MD at bedside performing I&D to patient. Lab work and antibiotics to be administered when MD completed.

## 2017-07-11 NOTE — ED Triage Notes (Addendum)
Per PCP states patient has been injecting himself with  testosterone-has developed and infection/abscess right buttock-states [facility] and was given doxycycline although infection has gotten worse-states he has been having difficulty walking due to pain radiating down right leg-being sent here for IV antibiotics

## 2017-07-11 NOTE — Patient Instructions (Signed)
Follow up as needed or as scheduled GO TO [facility]- They are expecting you! Please don't inject anything without a prescription! Call with any questions or concerns Hang in there!!!

## 2017-07-11 NOTE — ED Notes (Signed)
Patient transported to CT 

## 2017-07-11 NOTE — ED Notes (Signed)
Bed: WA06 Expected date:  Expected time:  Means of arrival:  Comments: 

## 2017-07-11 NOTE — ED Provider Notes (Signed)
[facility] DEPT Provider Note   CSN: [id number] Arrival date & time: 07/11/17  1044     History   Chief Complaint Chief Complaint  Patient presents with  . Abscess    HPI Gregg Crawford is a 21 y.o. male.  Patient c/o right buttock and prox thigh pain for the past 5-6 days. Had injected testosterone into area. Started as small red/sore area but has spread. No drainage.  In past few days, pain and redness progressively worse. Was placed on doxycycline 2 days ago but redness has spread since then. No fever/chills. No vomiting.    The history is provided by the patient.  Abscess  Associated symptoms: no fever and no headaches     Past Medical History:  Diagnosis Date  . Head injury 05/2010   reacurrent sxs dizziness, headaches,confusion had a negative MRI and EEG  . Viral encephalitis     Patient Active Problem List   Diagnosis Date Noted  . Physical exam 12/09/2015  . Spells 11/07/2015  . Near syncope 11/07/2015  . 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 Cough 12/01/2011  . Head injury, closed 12/01/2011  . Smokeless tobacco use 12/01/2011    Past Surgical History:  Procedure Laterality Date  . FRACTURE SURGERY     fracture bilateral ankles       Home Medications    Prior to Admission medications   Medication Sig Start Date End Date Taking? Authorizing Provider  doxycycline (VIBRAMYCIN) 100 MG capsule Take 1 capsule (100 mg total) by mouth 2 (two) times daily. 07/09/17  Yes Shelda Pal, DO  ibuprofen (ADVIL,MOTRIN) 200 MG tablet Take 800 mg by mouth every 6 (six) hours as needed for moderate pain.    Yes [provider]  valACYclovir (VALTREX) 500 MG tablet Take 1 tablet (500 mg total) by mouth daily. 05/30/17  Yes Brunetta Jeans, PA-C    Family History Family History  Problem Relation Age of Onset  . Hypertension Father   . Diabetes Maternal Grandfather   . Early death Maternal Grandfather   . Hypertension Maternal  Grandfather   . Heart disease Maternal Grandfather   . Melanoma Mother     Social History Social History  Substance Use Topics  . Smoking status: Never Smoker  . Smokeless tobacco: Current User    Types: Chew     Comment: dips  . Alcohol use 6.0 oz/week    10 Cans of beer per week     Comment: Occ     Allergies   Patient has no known allergies.   Review of Systems Review of Systems  Constitutional: Negative for fever.  HENT: Negative for sore throat.   Eyes: Negative for redness.  Respiratory: Negative for shortness of breath.   Cardiovascular: Negative for chest pain.  Gastrointestinal: Negative for abdominal pain.  Genitourinary: Negative for flank pain.  Musculoskeletal: Negative for back pain.  Skin: Negative for rash.  Neurological: Negative for headaches.  Hematological: Does not bruise/bleed easily.  Psychiatric/Behavioral: Negative for confusion.     Physical Exam Updated Vital Signs BP 118/84 (BP Location: Right Arm)   Pulse 90   Temp 97.9 F (36.6 C) (Oral)   Resp 16   Ht 1.778 m (5\' 10" )   Wt 93.9 kg (207 lb)   SpO2 100%   BMI 29.70 kg/m   Physical Exam  Constitutional: He is oriented to person, place, and time. He appears well-developed and well-nourished. No distress.  HENT:  Head: Atraumatic.  Eyes: Conjunctivae  are normal.  Neck: No tracheal deviation present.  Cardiovascular: Normal rate.   Pulmonary/Chest: Effort normal. No accessory muscle usage. No respiratory distress.  Abdominal: He exhibits no distension.  Musculoskeletal:  Erythema, sts, and tenderness right buttock and prox thigh.  No crepitus. No fluctuance felt, rather diffuse induration. Distal pulse palp.   Neurological: He is alert and oriented to person, place, and time.  Skin: Skin is warm and dry. He is not diaphoretic.  Psychiatric: He has a normal mood and affect.  Nursing note and vitals reviewed.    ED Treatments / Results  Labs (all labs ordered are listed,  but only abnormal results are displayed) Results for orders placed or performed during the hospital encounter of 07/11/17  CBC with Differential/Platelet  Result Value Ref Range   WBC 12.6 (H) 4.0 - 10.5 K/uL   RBC 5.06 4.22 - 5.81 MIL/uL   Hemoglobin 16.0 13.0 - 17.0 g/dL   HCT 46.9 39.0 - 52.0 %   MCV 92.7 78.0 - 100.0 fL   MCH 31.6 26.0 - 34.0 pg   MCHC 34.1 30.0 - 36.0 g/dL   RDW 12.9 11.5 - 15.5 %   Platelets 312 150 - 400 K/uL   Neutrophils Relative % 74 %   Neutro Abs 9.3 (H) 1.7 - 7.7 K/uL   Lymphocytes Relative 18 %   Lymphs Abs 2.3 0.7 - 4.0 K/uL   Monocytes Relative 7 %   Monocytes Absolute 0.9 0.1 - 1.0 K/uL   Eosinophils Relative 1 %   Eosinophils Absolute 0.1 0.0 - 0.7 K/uL   Basophils Relative 0 %   Basophils Absolute 0.1 0.0 - 0.1 K/uL  Basic metabolic panel  Result Value Ref Range   Sodium 139 135 - 145 mmol/L   Potassium 3.7 3.5 - 5.1 mmol/L   Chloride 102 101 - 111 mmol/L   CO2 27 22 - 32 mmol/L   Glucose, Bld 95 65 - 99 mg/dL   BUN 9 6 - 20 mg/dL   Creatinine, Ser 1.16 0.61 - 1.24 mg/dL   Calcium 9.2 8.9 - 10.3 mg/dL   GFR calc non Af Amer >60 >60 mL/min   GFR calc Af Amer >60 >60 mL/min   Anion gap 10 5 - 15   Ct Extremity Lower Right W Contrast  Result Date: 07/11/2017 CLINICAL DATA:  Swelling and redness in the right lateral buttock. EXAM: CT OF THE LOWER RIGHT EXTREMITY WITH CONTRAST TECHNIQUE: Multidetector CT imaging of the lower right extremity was performed according to the standard protocol following intravenous contrast administration. COMPARISON:  None. CONTRAST:  142mL ISOVUE-300 IOPAMIDOL (ISOVUE-300) INJECTION 61% FINDINGS: Bones/Joint/Cartilage No fracture or dislocation. Normal alignment. No joint effusion. Ligaments Ligaments are suboptimally evaluated by CT. Muscles and Tendons No intramuscular fluid collection or hematoma. No muscle atrophy. Mild hypodensity in the periphery of the right gluteus maximus muscle deep to the inflammatory  changes in the subcutaneous fat. Soft tissue No fluid collection or hematoma. No soft tissue mass. Hazy inflammatory changes in the subcutaneous fat overlying the right gluteus maximus muscle. IMPRESSION: 1. Hazy inflammatory changes in the subcutaneous fat overlying the right gluteus maximus muscle concerning for cellulitis. Adjacent to the area cellulitis, there is mild hypodensity in the right gluteus maximus muscle peripherally which may be reactive versus secondary to mild myositis. No focal fluid collection to suggest an abscess. Electronically Signed   By: Kathreen Devoid   On: 07/11/2017 14:00    EKG  EKG Interpretation None  Radiology Ct Extremity Lower Right W Contrast  Result Date: 07/11/2017 CLINICAL DATA:  Swelling and redness in the right lateral buttock. EXAM: CT OF THE LOWER RIGHT EXTREMITY WITH CONTRAST TECHNIQUE: Multidetector CT imaging of the lower right extremity was performed according to the standard protocol following intravenous contrast administration. COMPARISON:  None. CONTRAST:  153mL ISOVUE-300 IOPAMIDOL (ISOVUE-300) INJECTION 61% FINDINGS: Bones/Joint/Cartilage No fracture or dislocation. Normal alignment. No joint effusion. Ligaments Ligaments are suboptimally evaluated by CT. Muscles and Tendons No intramuscular fluid collection or hematoma. No muscle atrophy. Mild hypodensity in the periphery of the right gluteus maximus muscle deep to the inflammatory changes in the subcutaneous fat. Soft tissue No fluid collection or hematoma. No soft tissue mass. Hazy inflammatory changes in the subcutaneous fat overlying the right gluteus maximus muscle. IMPRESSION: 1. Hazy inflammatory changes in the subcutaneous fat overlying the right gluteus maximus muscle concerning for cellulitis. Adjacent to the area cellulitis, there is mild hypodensity in the right gluteus maximus muscle peripherally which may be reactive versus secondary to mild myositis. No focal fluid collection to  suggest an abscess. Electronically Signed   By: Kathreen Devoid   On: 07/11/2017 14:00    Procedures Procedures (including critical care time)  Medications Ordered in ED Medications  ceFAZolin (ANCEF) IVPB 2g/100 mL premix (2 g Intravenous New Bag/Given 07/11/17 1208)  sodium chloride 0.9 % bolus 1,000 mL (1,000 mLs Intravenous New Bag/Given 07/11/17 1208)  morphine 4 MG/ML injection 4 mg (4 mg Intravenous Given 07/11/17 1218)  ondansetron (ZOFRAN) injection 4 mg (4 mg Intravenous Given 07/11/17 1218)     Initial Impression / Assessment and Plan / ED Course  I have reviewed the triage vital signs and the nursing notes.  Pertinent labs & imaging results that were available during my care of the patient were reviewed by me and considered in my medical decision making (see chart for details).  Iv ns.   Labs.  Reviewed nursing notes and prior charts for additional history.   Bedside ultrasound, ?dirty fat, ?small abscess, no large abscess visualized.    Will get ct.   Morphine for pain.  Ancef 2 gm iv. vanc iv.   Medical service consulted for admission - hospitalists paged.     Final Clinical Impressions(s) / ED Diagnoses   Final diagnoses:  None    New Prescriptions New Prescriptions   No medications on file     Lajean Saver, MD 07/11/17 1423

## 2017-07-11 NOTE — Progress Notes (Signed)
   Subjective:    Patient ID: Gregg Crawford, male    DOB: August 03, 1996, 21 y.o.   MRN: [id number]  HPI Cellulitis- pt reports he gave himself the testosterone injxn (illeagally x10 weeks) on Thursday.  He developed a limp on Friday.  Started swelling on Saturday.  Went to [facility] Sunday and was started on Doxy.  Pt reports redness has far exceeded the marking made yesterday and is more swollen.  Has had 4 doses of Doxy but things are progressively worsening.  Pt has had chills and low grade temps- 99.   Review of Systems For ROS see HPI     Objective:   Physical Exam  Constitutional: He is oriented to person, place, and time. He appears well-developed and well-nourished. He appears distressed (obviously uncomfortable).  Neurological: He is alert and oriented to person, place, and time.  Antalgic gait  Skin: Skin is warm. There is erythema (pt has redness, warmth, extreme TTP over indurated R glute and hamstring w/ redness and marked swelling extending past margins drawn on Sunday).  Psychiatric: He has a normal mood and affect. His behavior is normal.  Vitals reviewed.         Assessment & Plan:  Cellulitis- pt has marked swelling of R buttock w/ swelling and redness extending past the margins drawn at [facility] on Sunday and despite 4 doses of Doxy.  At this time, he needs to go to the ER for likely IV abx.  Called [facility] ER and report was given.

## 2017-07-11 NOTE — ED Notes (Signed)
ED TO INPATIENT HANDOFF REPORT  Name/Age/Gender Gregg Crawford 21 y.o. male  Code Status Code Status History    This patient does not have a recorded code status. Please follow your organizational policy for patients in this situation.      Home/SNF/Other Home  Chief Complaint abcess  infection   Level of Care/Admitting Diagnosis ED Disposition    ED Disposition Condition Comment   Admit  Hospital Area: [facility] [[id number]]  Level of Care: Med-Surg [16]  Diagnosis: Cellulitis of buttock [166063]  Admitting Physician: Junius Argyle  Attending Physician: Annita Brod [[id number]]  Estimated length of stay: past midnight tomorrow  Certification:: I certify this patient will need inpatient services for at least 2 midnights  PT Class (Do Not Modify): Inpatient [101]  PT Acc Code (Do Not Modify): Private [1]       Medical History Past Medical History:  Diagnosis Date  . Head injury 05/2010   reacurrent sxs dizziness, headaches,confusion had a negative MRI and EEG  . Viral encephalitis     Allergies No Known Allergies  IV Location/Drains/Wounds Patient Lines/Drains/Airways Status   Active Line/Drains/Airways    Name:   Placement date:   Placement time:   Site:   Days:   Peripheral IV 07/11/17 Right Hand  07/11/17    1207    Hand    less than 1          Labs/Imaging Results for orders placed or performed during the hospital encounter of 07/11/17 (from the past 48 hour(s))  CBC with Differential/Platelet     Status: Abnormal   Collection Time: 07/11/17 12:10 PM  Result Value Ref Range   WBC 12.6 (H) 4.0 - 10.5 K/uL   RBC 5.06 4.22 - 5.81 MIL/uL   Hemoglobin 16.0 13.0 - 17.0 g/dL   HCT 46.9 39.0 - 52.0 %   MCV 92.7 78.0 - 100.0 fL   MCH 31.6 26.0 - 34.0 pg   MCHC 34.1 30.0 - 36.0 g/dL   RDW 12.9 11.5 - 15.5 %   Platelets 312 150 - 400 K/uL   Neutrophils Relative % 74 %   Neutro Abs 9.3 (H) 1.7 - 7.7 K/uL   Lymphocytes  Relative 18 %   Lymphs Abs 2.3 0.7 - 4.0 K/uL   Monocytes Relative 7 %   Monocytes Absolute 0.9 0.1 - 1.0 K/uL   Eosinophils Relative 1 %   Eosinophils Absolute 0.1 0.0 - 0.7 K/uL   Basophils Relative 0 %   Basophils Absolute 0.1 0.0 - 0.1 K/uL  Basic metabolic panel     Status: None   Collection Time: 07/11/17 12:10 PM  Result Value Ref Range   Sodium 139 135 - 145 mmol/L   Potassium 3.7 3.5 - 5.1 mmol/L   Chloride 102 101 - 111 mmol/L   CO2 27 22 - 32 mmol/L   Glucose, Bld 95 65 - 99 mg/dL   BUN 9 6 - 20 mg/dL   Creatinine, Ser 1.16 0.61 - 1.24 mg/dL   Calcium 9.2 8.9 - 10.3 mg/dL   GFR calc non Af Amer >60 >60 mL/min   GFR calc Af Amer >60 >60 mL/min    Comment: (NOTE) The eGFR has been calculated using the CKD EPI equation. This calculation has not been validated in all clinical situations. eGFR's persistently <60 mL/min signify possible Chronic Kidney Disease.    Anion gap 10 5 - 15   Ct Extremity Lower Right W Contrast  Result  Date: 07/11/2017 CLINICAL DATA:  Swelling and redness in the right lateral buttock. EXAM: CT OF THE LOWER RIGHT EXTREMITY WITH CONTRAST TECHNIQUE: Multidetector CT imaging of the lower right extremity was performed according to the standard protocol following intravenous contrast administration. COMPARISON:  None. CONTRAST:  178m ISOVUE-300 IOPAMIDOL (ISOVUE-300) INJECTION 61% FINDINGS: Bones/Joint/Cartilage No fracture or dislocation. Normal alignment. No joint effusion. Ligaments Ligaments are suboptimally evaluated by CT. Muscles and Tendons No intramuscular fluid collection or hematoma. No muscle atrophy. Mild hypodensity in the periphery of the right gluteus maximus muscle deep to the inflammatory changes in the subcutaneous fat. Soft tissue No fluid collection or hematoma. No soft tissue mass. Hazy inflammatory changes in the subcutaneous fat overlying the right gluteus maximus muscle. IMPRESSION: 1. Hazy inflammatory changes in the subcutaneous fat  overlying the right gluteus maximus muscle concerning for cellulitis. Adjacent to the area cellulitis, there is mild hypodensity in the right gluteus maximus muscle peripherally which may be reactive versus secondary to mild myositis. No focal fluid collection to suggest an abscess. Electronically Signed   By: HKathreen Devoid  On: 07/11/2017 14:00    Pending Labs Unresulted Labs    None      Vitals/Pain Today's Vitals   07/11/17 1530 07/11/17 1535 07/11/17 1626 07/11/17 1630  BP: 121/81   125/67  Pulse: 100   (!) 105  Resp: 15   16  Temp:      TempSrc:      SpO2: 98%   99%  Weight:      Height:      PainSc:  4  1      Isolation Precautions No active isolations  Medications Medications  sodium chloride 0.9 % bolus 1,000 mL (0 mLs Intravenous Stopped 07/11/17 1433)  ceFAZolin (ANCEF) IVPB 2g/100 mL premix (0 g Intravenous Stopped 07/11/17 1238)  morphine 4 MG/ML injection 4 mg (4 mg Intravenous Given 07/11/17 1218)  ondansetron (ZOFRAN) injection 4 mg (4 mg Intravenous Given 07/11/17 1218)  iopamidol (ISOVUE-300) 61 % injection 100 mL (100 mLs Intravenous Contrast Given 07/11/17 1312)  vancomycin (VANCOCIN) IVPB 1000 mg/200 mL premix (0 mg Intravenous Stopped 07/11/17 1603)  ibuprofen (ADVIL,MOTRIN) tablet 400 mg (400 mg Oral Given 07/11/17 1535)  HYDROcodone-acetaminophen (NORCO/VICODIN) 5-325 MG per tablet 2 tablet (2 tablets Oral Given 07/11/17 1535)    Mobility walks

## 2017-07-12 DIAGNOSIS — F553 Abuse of steroids or hormones: Secondary | ICD-10-CM | POA: Diagnosis not present

## 2017-07-12 DIAGNOSIS — Z833 Family history of diabetes mellitus: Secondary | ICD-10-CM | POA: Diagnosis not present

## 2017-07-12 DIAGNOSIS — L0390 Cellulitis, unspecified: Secondary | ICD-10-CM | POA: Diagnosis present

## 2017-07-12 DIAGNOSIS — K429 Umbilical hernia without obstruction or gangrene: Secondary | ICD-10-CM | POA: Diagnosis not present

## 2017-07-12 DIAGNOSIS — Z8249 Family history of ischemic heart disease and other diseases of the circulatory system: Secondary | ICD-10-CM | POA: Diagnosis not present

## 2017-07-12 DIAGNOSIS — M609 Myositis, unspecified: Secondary | ICD-10-CM | POA: Diagnosis not present

## 2017-07-12 DIAGNOSIS — Z720 Tobacco use: Secondary | ICD-10-CM | POA: Diagnosis not present

## 2017-07-12 DIAGNOSIS — L03317 Cellulitis of buttock: Secondary | ICD-10-CM | POA: Diagnosis not present

## 2017-07-12 LAB — CBC
HEMATOCRIT: 44.5 % (ref 39.0–52.0)
HEMOGLOBIN: 14.6 g/dL (ref 13.0–17.0)
MCH: 30.7 pg (ref 26.0–34.0)
MCHC: 32.8 g/dL (ref 30.0–36.0)
MCV: 93.7 fL (ref 78.0–100.0)
Platelets: 333 10*3/uL (ref 150–400)
RBC: 4.75 MIL/uL (ref 4.22–5.81)
RDW: 12.9 % (ref 11.5–15.5)
WBC: 7.2 10*3/uL (ref 4.0–10.5)

## 2017-07-12 NOTE — Care Management Note (Signed)
Case Management Note  Patient Details  Name: Gregg Crawford MRN: [id number] Date of Birth: 1996-05-05  Subjective/Objective:                  21 y.o. 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 Symptoms persisted so he went to urgent care yesterday, 9/10. He was given doxycycline and took this however symptoms persisted. In fact today, 9/11, redness was worse and so he went to go see his PCP. PCP found him to have cellulitis of the right buttock and given failure of outpatient by mouth antibiotics  Action/Plan: Date:  July 12, 2017 Chart reviewed for concurrent status and case management needs. Will continue to follow patient progress. Discharge Planning: following for needs Expected discharge date: [id number] Velva Harman, BSN, [place], [place]  Expected Discharge Date:   (unknown)               Expected Discharge Plan:  Home/Self Care  In-House Referral:     Discharge planning Services  CM Consult  Post Acute Care Choice:    Choice offered to:     DME Arranged:    DME Agency:     HH Arranged:    [place] Agency:     Status of Service:  In process, will continue to follow  If discussed at Long Length of Stay Meetings, dates discussed:    Additional Comments:  Leeroy Cha, RN 07/12/2017, 8:25 AM

## 2017-07-12 NOTE — Progress Notes (Signed)
[facility] Progress Note  Patient: Gregg Crawford KGU:[id number]   PCP: Midge Minium, MD DOB: Dec 26, 1995   DOA: 07/11/2017   DOS: 07/12/2017   Date of Service: the patient was seen and examined on 07/12/2017  Subjective: feeling better, is able to ambulate to the bathroom and come back without any significant pain. No nausea no vomiting.  Brief hospital course: Pt. with PMH of testosterone abuse; admitted on 07/11/2017, presented with complaint of right buttock swelling, was found to have cellulitis and possible myositis. Currently further plan is  Continue IV antibiotics.  Assessment and Plan: 1. Cellulitis and possible myositis. Presents with a right buttock swelling. Patient has taken doxycycline for 2 days at home without any improvement. CT scan of the right leg shows presence of cellulitis as well as myositis involving the gluteus maximus muscle. No abscess. Patient clinically getting better as well. We'll continue with IV antibiotics for today as well. Probably discharging back on doxycycline at home.  2. Testosterone abuse. Recommended patient to stop using this medication without any prescription.  3. Umbilical hernia. Recommend outpatient follow-up with surgery.  4. Active tobacco abuse. Admitted patient to stop using this.  Diet:regular diet DVT Prophylaxis: subcutaneous Heparin  Advance goals of care discussion: full code  Family Communication: no family was present at bedside, at the time of interview.   Disposition:  Discharge to  home.  Consultants: none Procedures: none  Antibiotics: Anti-infectives    Start     Dose/Rate Route Frequency Ordered Stop   07/11/17 2000  ceFAZolin (ANCEF) IVPB 2g/100 mL premix     2 g 200 mL/hr over 30 Minutes Intravenous Every 8 hours 07/11/17 1846     07/11/17 1430  vancomycin (VANCOCIN) IVPB 1000 mg/200 mL premix     1,000 mg 200 mL/hr over 60 Minutes Intravenous  Once 07/11/17 1422 07/11/17 1603   07/11/17 1145  ceFAZolin (ANCEF) IVPB 2g/100 mL premix     2 g 200 mL/hr over 30 Minutes Intravenous  Once 07/11/17 1134 07/11/17 1238       Objective: Physical Exam: Vitals:   07/11/17 1756 07/11/17 2100 07/12/17 0505 07/12/17 1532  BP: (!) 120/58 119/61 108/64 133/64  Pulse: 67 70 (!) 58 75  Resp: 16 18 16 20   Temp: 98.1 F (36.7 C) 98 F (36.7 C) 97.8 F (36.6 C) 98.3 F (36.8 C)  TempSrc: Oral Oral Oral Oral  SpO2: 97% 97% 96% 99%  Weight: 94.1 kg (207 lb 7.3 oz)     Height: 5\' 10"  (1.778 m)       Intake/Output Summary (Last 24 hours) at 07/12/17 1915 Last data filed at 07/12/17 1500  Gross per 24 hour  Intake           468.17 ml  Output                0 ml  Net           468.17 ml   Filed Weights   07/11/17 1054 07/11/17 1756  Weight: 93.9 kg (207 lb) 94.1 kg (207 lb 7.3 oz)   General: Alert, Awake and Oriented to Time, Place and Person. Appear in mild distress, affect appropriate Eyes: PERRL, Conjunctiva normal ENT: Oral Mucosa clear moist. Neck: no JVD, no Abnormal Mass Or lumps Cardiovascular: S1 and S2 Present, no Murmur, Peripheral Pulses Present Respiratory: normal respiratory effort, Bilateral Air entry equal and Decreased, no use of accessory muscle, Clear to Auscultation, no Crackles, no wheezes Abdomen: Bowel Sound  present, Soft and no tenderness, no hernia Skin: right thigh redness,  induration Extremities: no Pedal edema, no calf tenderness Neurologic: Grossly no focal neuro deficit. Bilaterally Equal motor strength  Data Reviewed: CBC:  Recent Labs Lab 07/11/17 1210 07/12/17 0710  WBC 12.6* 7.2  NEUTROABS 9.3*  --   HGB 16.0 14.6  HCT 46.9 44.5  MCV 92.7 93.7  PLT 312 416   Basic Metabolic Panel:  Recent Labs Lab 07/11/17 1210  NA 139  K 3.7  CL 102  CO2 27  GLUCOSE 95  BUN 9  CREATININE 1.16  CALCIUM 9.2    Liver Function Tests: No results for input(s): AST, ALT, ALKPHOS, BILITOT, PROT, ALBUMIN in the last 168 hours. No  results for input(s): LIPASE, AMYLASE in the last 168 hours. No results for input(s): AMMONIA in the last 168 hours. Coagulation Profile: No results for input(s): INR, PROTIME in the last 168 hours. Cardiac Enzymes: No results for input(s): CKTOTAL, CKMB, CKMBINDEX, TROPONINI in the last 168 hours. BNP (last 3 results) No results for input(s): PROBNP in the last 8760 hours. CBG: No results for input(s): GLUCAP in the last 168 hours. Studies: No results found.  Scheduled Meds: . enoxaparin (LOVENOX) injection  40 mg Subcutaneous Q24H   Continuous Infusions: . sodium chloride Stopped (07/12/17 1536)  .  ceFAZolin (ANCEF) IV Stopped (07/12/17 1158)   PRN Meds: acetaminophen **OR** acetaminophen, ketorolac, ondansetron **OR** ondansetron (ZOFRAN) IV, traMADol  Time spent: 35 minutes  Author: Berle Mull, MD [facility] Pager: [phone] 07/12/2017 7:15 PM  If 7PM-7AM, please contact night-coverage at [email or web address], password [facility]

## 2017-07-13 DIAGNOSIS — L03317 Cellulitis of buttock: Secondary | ICD-10-CM | POA: Diagnosis not present

## 2017-07-13 DIAGNOSIS — Z8249 Family history of ischemic heart disease and other diseases of the circulatory system: Secondary | ICD-10-CM | POA: Diagnosis not present

## 2017-07-13 DIAGNOSIS — Z720 Tobacco use: Secondary | ICD-10-CM | POA: Diagnosis not present

## 2017-07-13 DIAGNOSIS — Z833 Family history of diabetes mellitus: Secondary | ICD-10-CM | POA: Diagnosis not present

## 2017-07-13 DIAGNOSIS — K429 Umbilical hernia without obstruction or gangrene: Secondary | ICD-10-CM | POA: Diagnosis not present

## 2017-07-13 DIAGNOSIS — M609 Myositis, unspecified: Secondary | ICD-10-CM | POA: Diagnosis not present

## 2017-07-13 DIAGNOSIS — F553 Abuse of steroids or hormones: Secondary | ICD-10-CM | POA: Diagnosis not present

## 2017-07-13 LAB — HIV ANTIBODY (ROUTINE TESTING W REFLEX): HIV SCREEN 4TH GENERATION: NONREACTIVE

## 2017-07-13 MED ORDER — NAPROXEN 500 MG PO TBEC: 500.0000 mg | DELAYED_RELEASE_TABLET | Freq: Two times a day (BID) | ORAL | 0 refills | Status: AC

## 2017-07-13 NOTE — Progress Notes (Signed)
Date: July 13, 2017 Chart reviewed for discharge orders: None found for case management. Rhonda Davis,BSN,RN3, CCM/[phone] 

## 2017-07-13 NOTE — Progress Notes (Signed)
Patient given discharge instructions, and verbalized an understanding of all discharge instructions.  Patient agrees with discharge plan, and is being discharged in stable medical condition. Patient escorted to the emergency room entrance.

## 2017-07-14 ENCOUNTER — Other Ambulatory Visit: Payer: Self-pay | Admitting: *Deleted

## 2017-07-14 ENCOUNTER — Telehealth: Payer: Self-pay

## 2017-07-14 MED FILL — VALACYCLOVIR HCL 500 MG TAB: 500 | 30 days supply | Qty: 30 | Fill #0

## 2017-07-14 MED FILL — NAPROXEN DR 500 MG TABLET: 500 | 7 days supply | Qty: 14 | Fill #0

## 2017-07-14 NOTE — Patient Outreach (Signed)
[facility]) Care Management  07/14/2017  Gregg Crawford [birth year] [id number]  Subjective: Telephone call to patient's home number, spoke with patient's mother Gregg Crawford), states her son Gregg Crawford can reached at the following number [phone].   States Gregg Crawford has bad mobile phone reception at this house,  if I am unable to reach him, call her back, and she will reach him, and ask him to call me.    Objective: Per chart review, patient hospitalized 07/11/17 - 07/13/17 for Cellulitis of buttock.  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   Patient also has a history of Abuse of steroids or hormones, Viral encephalitis, and Umbilical hernia.       Assessment: Received [facility] Transition of care referral on 07/12/17.  Transition of care follow up pending patient contact.     Plan: RNCM will call patient for 2nd telephone outreach attempt, transition of care follow up, within 10 business days if no return call.    [name] Annia Friendly, BSN, [place] Management [facility] Telephonic CM Phone: [phone] Fax: [phone]

## 2017-07-14 NOTE — Telephone Encounter (Signed)
Transition Care Management Follow-up Telephone Call   Date discharged? 07/13/2017   How have you been since you were released from the hospital? "I'm doing okay"   Do you understand why you were in the hospital? yes   Do you understand the discharge instructions? yes   Where were you discharged to? Home.   Items Reviewed:  Medications reviewed: yes  Allergies reviewed: yes  Dietary changes reviewed: no  Referrals reviewed: no   Functional Questionnaire:   Activities of Daily Living (ADLs):   He states they are independent in the following: ambulation, bathing and hygiene, feeding, continence, grooming, toileting and dressing States they require assistance with the following: None   Any transportation issues/concerns?: no   Any patient concerns? No, "it's still swollen, but it's much better"   Confirmed importance and date/time of follow-up visits scheduled yes  Provider Appointment booked with PCP on 07/21/17 @ 11  Confirmed with patient if condition begins to worsen call PCP or go to the ER.  Patient was given the office number and encouraged to call back with question or concerns.  : yes

## 2017-07-17 ENCOUNTER — Other Ambulatory Visit: Payer: Self-pay | Admitting: *Deleted

## 2017-07-17 ENCOUNTER — Encounter: Payer: Self-pay | Admitting: *Deleted

## 2017-07-17 NOTE — Patient Outreach (Signed)
[facility]) Care Management  07/17/2017  Gregg Crawford [birth year] [id number]  Subjective: Telephone call to patient's mobile number, spoke with patient, and HIPAA verified.  Discussed [facility] Care Management [facility] Transition of care follow up, patient voiced understanding, and is in agreement to follow up.   Patient states he is doing good and has follow up appointment with primary MD on 07/21/17. Patient voices understanding of medical diagnosis and treatment plan.  States he is accessing the following [facility] benefits: outpatient pharmacy (as needed lives in [place]), hospital indemnity (not sure if he has benefit, will ask mother ,who is Furniture conservator/restorer, she will file claim if appropriate), and not sure if he is eligible for family medical leave act [employer]) , will verify with his employer.   States he used his vacation time to cover being out of work last week.  Patient states he does not have any education material, transition of care, care coordination, disease management, disease monitoring, transportation, community resource, or pharmacy needs at this time. States he is very appreciative of the follow up and is in agreement to receive [place] Management information.   Objective: Per chart review, patient hospitalized 07/11/17 - 07/13/17 for Cellulitis of buttock.  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   Patient also has a history of Abuse of steroids or hormones, Viral encephalitis, and Umbilical hernia.       Assessment: Received [facility]MR Transition of care referral on 07/12/17.  Transition of care follow up completed, no care management needs, and will proceed with case closure.    Plan: RNCM will send patient successful outreach letter, [facility] pamphlet, and magnet. RNCM will send case closure due to follow up completed / no care management needs request to [facility].     Argelia Formisano H. Annia Friendly, BSN, [place] Management [facility] Telephonic  CM Phone: [phone] Fax: [phone]

## 2017-07-17 NOTE — Discharge Summary (Signed)
[facility] Discharge Summary   Patient: Gregg Crawford CWC:[id number]   PCP: Midge Minium, MD DOB: September 08, 1996   Date of admission: 07/11/2017   Date of discharge: 07/13/2017    Discharge Diagnoses:  Principal Problem:   Cellulitis of buttock Active Problems:   Umbilical hernia   Abuse of steroids or hormones   Cellulitis   Admitted From: home Disposition:  home  Recommendations for Outpatient Follow-up:  1. Follow-up with PCP in one week.   Follow-up Information    Midge Minium, MD. Schedule an appointment as soon as possible for a visit in 1 week(s).   Specialty:  Family Medicine Contact information: [address] [place] [address] [phone]          Diet recommendation: Regular diet  Activity: The patient is advised to gradually reintroduce usual activities.  Discharge Condition: good  Code Status: Full code  History of present illness: As per the H and P dictated on admission, ": Gregg Crawford is a 21 y.o. 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 Symptoms persisted so he went to urgent care yesterday, 9/10. He was given doxycycline and took this however symptoms persisted. In fact today, 9/11, redness was worse and so he went to go see his PCP. PCP found him to have cellulitis of the right buttock and given failure of outpatient by mouth antibiotics, refer him to the emergency room.  ED Course: In the emergency room, patient had a mild white count of 12.3. Given large list of area of infection, there was concern for abscess. Patient underwent a CT of the lower extremity which noted cellulitis of the right gluteus maximus muscle and a mild hypodensity which felt to be reactive versus secondary to mild myositis. No focal fluid collection was noted. Patient was  given a dose of IV Ancef and IV vancomycin and hospitalists were called for further evaluation.   Unrelated, patient started noticing complaints of a mild umbilical hernia about 2-3 weeks ago. He says that it is easily reducible, but the moment he does anything active, it comes back out. It has never gotten stuck"  Hospital Course:  Summary of his active problems in the hospital is as following. 1. Cellulitis and mild myositis. Presents with a right buttock swelling. Patient has taken doxycycline for 2 days at home without any improvement. CT scan of the right leg shows presence of cellulitis as well as myositis involving the gluteus maximus muscle. No abscess. Patient clinically getting better as well. Patient was given IV Ancef in the hospital with which he had significant improvement in his cellulitis. At present I do not feel that the patient had failure with doxycycline and therefore we will continue doxycycline regimen that patient was prescribed at home. Also recommend use naproxen for pain.  2. Testosterone abuse. Recommended patient to stop using this medication without any prescription.  3. Umbilical hernia. Recommend outpatient follow-up with surgery.  4. Active tobacco abuse. counseled with patient to stop using this.   All other chronic medical condition were stable during the hospitalization.  Patient was ambulatory without any assistance. On the day of the discharge the patient's vitals were stable, and no other acute medical condition were reported by patient. the patient was felt safe to be discharge at home with family.  Procedures and Results:  none  Consultations:  none  DISCHARGE MEDICATION: Discharge Medication List as of 07/13/2017 10:47 AM    START taking these medications   Details  naproxen (EC NAPROSYN) 500 MG EC tablet Take 1 tablet (500 mg total) by mouth 2 (two) times daily with a meal., Starting Thu 07/13/2017, Until Thu 07/20/2017, Normal        CONTINUE these medications which have NOT CHANGED   Details  doxycycline (VIBRAMYCIN) 100 MG capsule Take 1 capsule (100 mg total) by mouth 2 (two) times daily., Starting Sun 07/09/2017, Normal    valACYclovir (VALTREX) 500 MG tablet Take 1 tablet (500 mg total) by mouth daily., Starting Tue 05/30/2017, Normal      STOP taking these medications     ibuprofen (ADVIL,MOTRIN) 200 MG tablet        No Known Allergies Discharge Instructions    Diet general    Complete by:  As directed    Discharge instructions    Complete by:  As directed    It is important that you read following instructions as well as go over your medication list with RN to help you understand your care after this hospitalization.  Discharge Instructions: Please follow-up with PCP in one week  Please request your primary care physician to go over all Hospital Tests and Procedure/Radiological results at the follow up,  Please get all Hospital records sent to your PCP by signing hospital release before you go home.   Do not take more than prescribed Pain, Sleep and Anxiety Medications. You were cared for by a hospitalist during your hospital stay. If you have any questions about your discharge medications or the care you received while you were in the hospital after you are discharged, you can call the unit and ask to speak with the hospitalist on call if the hospitalist that took care of you is not available.  Once you are discharged, your primary care physician will handle any further medical issues. Please note that NO REFILLS for any discharge medications will be authorized once you are discharged, as it is imperative that you return to your primary care physician (or establish a relationship with a primary care physician if you do not have one) for your aftercare needs so that they can reassess your need for medications and monitor your lab values. You Must read complete instructions/literature along with all the  possible adverse reactions/side effects for all the Medicines you take and that have been prescribed to you. Take any new Medicines after you have completely understood and accept all the possible adverse reactions/side effects. Wear Seat belts while driving. If you have smoked or chewed Tobacco in the last 2 yrs please stop smoking and/or stop any Recreational drug use.   Increase activity slowly    Complete by:  As directed      Discharge Exam: Filed Weights   07/11/17 1054 07/11/17 1756  Weight: 93.9 kg (207 lb) 94.1 kg (207 lb 7.3 oz)   Vitals:   07/12/17 2153 07/13/17 0702  BP: 108/62 (!) 113/51  Pulse: (!) 57 (!) 50  Resp: 18 16  Temp: 97.6 F (36.4 C) (!) 97.4 F (36.3 C)  SpO2: 97% 98%   General: Appear in no distress, no Rash; Oral Mucosa moist.No redness of the right gluteal region improved as well as no warmth. Cardiovascular: S1 and S2 Present, no Murmur, no JVD Respiratory: Bilateral Air entry present and Clear to Auscultation, no Crackles, no wheezes Abdomen: Bowel Sound present, Soft and no tenderness  Extremities: no Pedal edema, no calf tenderness Neurology: Grossly no focal neuro deficit.  The results of significant diagnostics from this hospitalization (including imaging, microbiology, ancillary and laboratory) are listed below for reference.    Significant Diagnostic Studies: Ct Extremity Lower Right W Contrast  Result Date: 07/11/2017 CLINICAL DATA:  Swelling and redness in the right lateral buttock. EXAM: CT OF THE LOWER RIGHT EXTREMITY WITH CONTRAST TECHNIQUE: Multidetector CT imaging of the lower right extremity was performed according to the standard protocol following intravenous contrast administration. COMPARISON:  None. CONTRAST:  120mL ISOVUE-300 IOPAMIDOL (ISOVUE-300) INJECTION 61% FINDINGS: Bones/Joint/Cartilage No fracture or dislocation. Normal alignment. No joint effusion. Ligaments Ligaments are suboptimally evaluated by CT. Muscles and Tendons No  intramuscular fluid collection or hematoma. No muscle atrophy. Mild hypodensity in the periphery of the right gluteus maximus muscle deep to the inflammatory changes in the subcutaneous fat. Soft tissue No fluid collection or hematoma. No soft tissue mass. Hazy inflammatory changes in the subcutaneous fat overlying the right gluteus maximus muscle. IMPRESSION: 1. Hazy inflammatory changes in the subcutaneous fat overlying the right gluteus maximus muscle concerning for cellulitis. Adjacent to the area cellulitis, there is mild hypodensity in the right gluteus maximus muscle peripherally which may be reactive versus secondary to mild myositis. No focal fluid collection to suggest an abscess. Electronically Signed   By: Kathreen Devoid   On: 07/11/2017 14:00    Microbiology: No results found for this or any previous visit (from the past 240 hour(s)).   Labs: CBC:  Recent Labs Lab 07/11/17 1210 07/12/17 0710  WBC 12.6* 7.2  NEUTROABS 9.3*  --   HGB 16.0 14.6  HCT 46.9 44.5  MCV 92.7 93.7  PLT 312 415   Basic Metabolic Panel:  Recent Labs Lab 07/11/17 1210  NA 139  K 3.7  CL 102  CO2 27  GLUCOSE 95  BUN 9  CREATININE 1.16  CALCIUM 9.2   Time spent: 35 minutes  Signed:  Cael Worth  [facility] 07/13/2017 , 10:54 AM

## 2017-07-21 ENCOUNTER — Ambulatory Visit: Payer: 59 | Admitting: Family Medicine

## 2017-09-01 ENCOUNTER — Telehealth: Payer: Self-pay | Admitting: Family Medicine

## 2017-09-01 DIAGNOSIS — A6002 Herpesviral infection of other male genital organs: Secondary | ICD-10-CM

## 2017-09-01 MED ORDER — VALACYCLOVIR HCL 500 MG PO TABS: 500.0000 mg | ORAL_TABLET | Freq: Every day | ORAL | 3 refills | Status: DC

## 2017-09-01 MED FILL — VALACYCLOVIR HCL 500 MG TAB: 500 | 30 days supply | Qty: 30 | Fill #0

## 2017-09-01 NOTE — Telephone Encounter (Signed)
Pt needs refill on valacyclovir, pt states that he has ran out, [employer].

## 2017-09-01 NOTE — Telephone Encounter (Signed)
Called to inform pt that rx was sent to pharmacy.

## 2018-04-26 ENCOUNTER — Telehealth: Payer: Self-pay | Admitting: Family Medicine

## 2018-04-26 NOTE — Telephone Encounter (Signed)
Copied from [place] [id number]. Topic: Quick Communication - Rx Refill/Question >> Apr 26, 2018  1:27 PM Oliver Pila B wrote: Medication: valACYclovir (VALTREX) 500 MG tablet [[id number]]   Pt wants to speak w/ pcp/nurse about medication above b/c its not working pt states, call to advise

## 2018-04-27 MED ORDER — VALACYCLOVIR HCL 1 G PO TABS: 1000.0000 mg | ORAL_TABLET | Freq: Every day | ORAL | 3 refills | Status: DC

## 2018-04-27 NOTE — Telephone Encounter (Signed)
We will give pt 1000mg  Valtrex BID x3 days to treat current outbreak and then he is to start daily suppressive therapy- 1 tab daily.  #30, 3 refills

## 2018-04-27 NOTE — Telephone Encounter (Signed)
Pt states that he has been experiencing breakouts with no improvement while taking Valacyclovir daily. Pt states it is not working at this time. Pt states he was originally taking the medication for 5 days while experiencing breakouts. Pt states that after the Valacyclovir for 5 days  the breakout would return. Pt states he even tried to take the medication daily for 2 weeks but his breakout still did not clear up completely. Pt states he only has 4 pills left on current prescription. If possible he would like for a medication refill to be sent to [facility] in [place]. Pt states the last time that medication was filled it had to be sent to [facility] but is unsure if this was due to insurance coverage.

## 2018-04-27 NOTE — Telephone Encounter (Signed)
Medication filled to pharmacy as requested.  Pt made aware.  

## 2018-04-27 NOTE — Telephone Encounter (Signed)
Please advise 

## 2018-05-08 ENCOUNTER — Ambulatory Visit: Payer: Self-pay | Admitting: Family Medicine

## 2018-05-08 NOTE — Telephone Encounter (Signed)
Pt called c/o red raised rash to legs, stomach,shoulder back and arms. Some of the bumps are the size of  BB, some are so close together and it looks the size of a penny.  Pt stated rash is worse on legs and stomach. Pt stated the rash began 05/03/18. Pt c/o itching. Pt stated his dose of Valtrex was increased to 1000 mg 04/27/18. Pt thinks his rash is due to the dose increase.  Pt denies fever, sore throat, fever or joint pain. Care advice given for the rash. Pt verbalized understanding. Appt made with Dr Birdie Riddle tomorrow to evaluate rash.  During call pt c/o lower back pain. Pt stated pain is located central lower back to the left. Pt rates pain as moderate and has sharp pain when sits a certain way. Pt works in a [employer] and does a lot of heavy lifting. Pt has tried Bayer back and body for the pain. Pt states that has eased his back pain somewhat.Pt denies weakness, numbness, or problems with bowel and bladder control. Pt given home care advice and verbalized understanding.  Reason for Disposition . Hives or itching . Back pain  Answer Assessment - Initial Assessment Questions 1. APPEARANCE of RASH: "Describe the rash." (e.g., spots, blisters, raised areas, skin peeling, scaly)     Little red bumps some are bigger than others, raised 2. SIZE: "How big are the spots?" (e.g., tip of pen, eraser, coin; inches, centimeters)     Some are eraser some are like a BB, some are so close together it looks like a penny 3. LOCATION: "Where is the rash located?"     Legs, stomach, shoulder back and arms- worse on legs and stomach 4. COLOR: "What color is the rash?" (Note: It is difficult to assess rash color in people with darker-colored skin. When this situation occurs, simply ask the caller to describe what they see.)     red 5. ONSET: "When did the rash begin?"     05/03/18 6. FEVER: "Do you have a fever?" If so, ask: "What is your temperature, how was it measured, and when did it start?"     no 7.  ITCHING: "Does the rash itch?" If so, ask: "How bad is the itch?" (Scale 1-10; or mild, moderate, severe)     itch 8. CAUSE: "What do you think is causing the rash?"     Valtrex 9. NEW MEDICATION: "What new medication are you taking?" (e.g., name of antibiotic) "When did you start taking this medication?".     Valtrex- 1 week ago started taking 1000 mg Valtrex 10. OTHER SYMPTOMS: "Do you have any other symptoms?" (e.g., sore throat, fever, joint pain)       Lower back center left side 11. PREGNANCY: "Is there any chance you are pregnant?" "When was your last menstrual period?"       n/a  Answer Assessment - Initial Assessment Questions 1. ONSET: "When did the pain begin?"      today 2. LOCATION: "Where does it hurt?" (upper, mid or lower back)     Lower back center left 3. SEVERITY: "How bad is the pain?"  (e.g., Scale 1-10; mild, moderate, or severe)   - MILD (1-3): doesn't interfere with normal activities    - MODERATE (4-7): interferes with normal activities or awakens from sleep    - SEVERE (8-10): excruciating pain, unable to do any normal activities      Moderate 4/10 4. PATTERN: "Is the pain constant?" (e.g., yes, no;  constant, intermittent)      No- sharp pain when pt sits a certain way 5. RADIATION: "Does the pain shoot into your legs or elsewhere?"     no 6. CAUSE:  "What do you think is causing the back pain?"      Pt doesn't know 7. 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 MEDICATIONS: "What have you taken so far for the pain?" (e.g., nothing, acetaminophen, NSAIDS)     Bayer back and Body taken 2 hours ago-pt states that pain has eased up but not working at present 9. NEUROLOGIC SYMPTOMS: "Do you have any weakness, numbness, or problems with bowel/bladder control?"     no 10. OTHER SYMPTOMS: "Do you have any other symptoms?" (e.g., fever, abdominal pain, burning with urination, blood in  urine)       no 11. PREGNANCY: "Is there any chance you are pregnant?" (e.g., yes, no; LMP)       n/a  Protocols used: RASH - WIDESPREAD ON DRUGS-A-AH, BACK PAIN-A-AH

## 2018-05-09 ENCOUNTER — Other Ambulatory Visit: Payer: Self-pay

## 2018-05-09 ENCOUNTER — Ambulatory Visit (INDEPENDENT_AMBULATORY_CARE_PROVIDER_SITE_OTHER): Payer: 59 | Admitting: Family Medicine

## 2018-05-09 ENCOUNTER — Ambulatory Visit: Payer: 59 | Admitting: Family Medicine

## 2018-05-09 ENCOUNTER — Encounter: Payer: Self-pay | Admitting: Family Medicine

## 2018-05-09 VITALS — BP 116/83 | HR 72 | Temp 97.9°F | Resp 16 | Ht 70.0 in | Wt 210.5 lb

## 2018-05-09 DIAGNOSIS — S80861A Insect bite (nonvenomous), right lower leg, initial encounter: Secondary | ICD-10-CM | POA: Diagnosis not present

## 2018-05-09 DIAGNOSIS — S80862A Insect bite (nonvenomous), left lower leg, initial encounter: Secondary | ICD-10-CM

## 2018-05-09 DIAGNOSIS — W57XXXA Bitten or stung by nonvenomous insect and other nonvenomous arthropods, initial encounter: Secondary | ICD-10-CM

## 2018-05-09 MED ORDER — PREDNISONE 10 MG PO TABS: ORAL_TABLET | ORAL | 0 refills | Status: DC

## 2018-05-09 MED ORDER — TRIAMCINOLONE ACETONIDE 0.1 % EX OINT: 1.0000 "application " | TOPICAL_OINTMENT | Freq: Two times a day (BID) | CUTANEOUS | 1 refills | Status: DC

## 2018-05-09 NOTE — Telephone Encounter (Signed)
FYI, this is the triage note on your pt this afternoon. It was not routed to you yesterday.

## 2018-05-09 NOTE — Progress Notes (Signed)
   Subjective:    Patient ID: Gregg Crawford, male    DOB: 1996-03-14, 22 y.o.   MRN: [id number]  HPI Rash- 'a bunch of little red bumps'.  Started on Saturday, have been progressively worsening.  Very itchy.  First noticed on legs.  No recent camping, traveling.  Mows yard w/ riding mower.  No one at home w/ similar bumps.  Pt has not had chickenpox.     Review of Systems For ROS see HPI     Objective:   Physical Exam  Constitutional: He is oriented to person, place, and time. He appears well-developed and well-nourished. No distress.  HENT:  Head: Normocephalic and atraumatic.  Neurological: He is alert and oriented to person, place, and time.  Skin: Skin is warm and dry.  Scattered erythematous bites on legs and along waist band.  Some are vesicular and others are excoriated.  No rash present.  Psychiatric: He has a normal mood and affect. His behavior is normal. Thought content normal.  Vitals reviewed.         Assessment & Plan:  Bug bites- new.  Pt's 'rash' is consistent w/ multiple bites.  Reviewed dx and tx.  Will start Prednisone taper due to excessive itching.  Triamcinolone as needed.  Reviewed supportive care and red flags that should prompt return.  Pt expressed understanding and is in agreement w/ plan.

## 2018-05-09 NOTE — Patient Instructions (Signed)
Follow up as needed or as scheduled START the Prednisone as directed- take w/ food.  3 pills at the same time x3 days, and then 2 pills at the same time x3 days, and then 1 tab daily Use the ointment on the very itchy areas twice daily Wash all sheets and towels Call with any questions or concerns Hang in there!!!

## 2018-08-07 DIAGNOSIS — S81811A Laceration without foreign body, right lower leg, initial encounter: Secondary | ICD-10-CM | POA: Diagnosis not present

## 2018-08-07 DIAGNOSIS — S6992XA Unspecified injury of left wrist, hand and finger(s), initial encounter: Secondary | ICD-10-CM | POA: Diagnosis not present

## 2018-08-07 DIAGNOSIS — S81011A Laceration without foreign body, right knee, initial encounter: Secondary | ICD-10-CM | POA: Diagnosis not present

## 2018-08-07 DIAGNOSIS — S61512A Laceration without foreign body of left wrist, initial encounter: Secondary | ICD-10-CM | POA: Diagnosis not present

## 2018-09-07 ENCOUNTER — Ambulatory Visit (INDEPENDENT_AMBULATORY_CARE_PROVIDER_SITE_OTHER): Payer: 59 | Admitting: Family Medicine

## 2018-09-07 ENCOUNTER — Other Ambulatory Visit: Payer: Self-pay | Admitting: Physician Assistant

## 2018-09-07 ENCOUNTER — Encounter: Payer: Self-pay | Admitting: Family Medicine

## 2018-09-07 ENCOUNTER — Other Ambulatory Visit: Payer: Self-pay

## 2018-09-07 VITALS — BP 120/80 | HR 80 | Temp 98.1°F | Resp 16 | Ht 70.0 in | Wt 213.0 lb

## 2018-09-07 DIAGNOSIS — R21 Rash and other nonspecific skin eruption: Secondary | ICD-10-CM

## 2018-09-07 DIAGNOSIS — D239 Other benign neoplasm of skin, unspecified: Secondary | ICD-10-CM | POA: Diagnosis not present

## 2018-09-07 DIAGNOSIS — A630 Anogenital (venereal) warts: Secondary | ICD-10-CM | POA: Diagnosis not present

## 2018-09-07 DIAGNOSIS — D2262 Melanocytic nevi of left upper limb, including shoulder: Secondary | ICD-10-CM | POA: Diagnosis not present

## 2018-09-07 DIAGNOSIS — D485 Neoplasm of uncertain behavior of skin: Secondary | ICD-10-CM | POA: Diagnosis not present

## 2018-09-07 MED ORDER — TRIAMCINOLONE ACETONIDE 0.1 % EX OINT: 1.0000 "application " | TOPICAL_OINTMENT | Freq: Two times a day (BID) | CUTANEOUS | 1 refills | Status: DC

## 2018-09-07 MED ORDER — VALACYCLOVIR HCL 1 G PO TABS: 1000.0000 mg | ORAL_TABLET | Freq: Every day | ORAL | 6 refills | Status: DC

## 2018-09-07 NOTE — Progress Notes (Signed)
   Subjective:    Patient ID: Gregg Crawford, male    DOB: 1996-04-11, 22 y.o.   MRN: [id number]  HPI Herpes- taking 1000mg  Valtrex daily but continues to have outbreaks.  Taking L Lysine and D3 which cleared up outbreak faster.  Outbreaks are occurring ~1x/week.  He reports that as soon as rash clears, he will be asymptomatic x2-3 days and then blisters return.  Blisters occur in 1 of 2 spots.  Pt reports lesions are 'never really painful'.     Review of Systems For ROS see HPI     Objective:   Physical Exam  Constitutional: He is oriented to person, place, and time. He appears well-developed and well-nourished. No distress.  Neurological: He is alert and oriented to person, place, and time.  Skin: Skin is warm and dry.  No vesicle lesions on penis or in groin.  The area in question today is a patch of dry, irritated skin on penile shaft consistent w/ eczema or friction irritation.  Psychiatric: He has a normal mood and affect. His behavior is normal. Thought content normal.  Vitals reviewed.         Assessment & Plan:  Penile eruption- more consistent w/ eczema than HSV.  Continue daily Valtrex for suppression but add low potency Triamcinolone as needed.  Reviewed supportive care and red flags that should prompt return.  Pt expressed understanding and is in agreement w/ plan.

## 2018-09-07 NOTE — Patient Instructions (Addendum)
Follow up as needed or as scheduled CONTINUE the Valtrex daily CONTINUE the Lysine and D3 USE the Triamcinolone ointment twice daily as needed for irritated areas Make sure you are wearing breathable, cotton underwear to keep things dry Call with any questions or concerns Have a great weekend!

## 2018-10-01 IMAGING — CT CT EXTREM LOW W/ CM*R*
2 of 5 series · 14 of 46 positions shown, 16 images · IV contrast (agent unspecified)
Comparison: None.

CONTRAST:  100mL UFMU16-SEE IOPAMIDOL (UFMU16-SEE) INJECTION 61%

CLINICAL DATA: Swelling and redness in the right lateral buttock.

EXAM:
CT OF THE LOWER RIGHT EXTREMITY WITH CONTRAST
TECHNIQUE: Multidetector CT imaging of the lower right extremity was performed
according to the standard protocol following intravenous contrast
administration.

[Series 7: axial st · axial · 0.77mm/px · z∈[+747,+1254]mm · 11 of 195 slices shown, 13 images]
[im 13/195  soft-tissue]
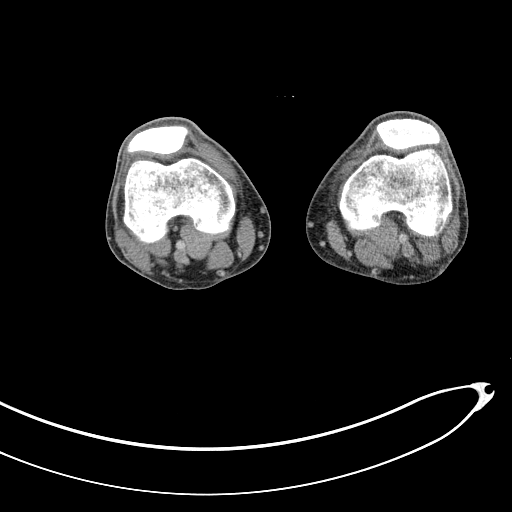
[im 13/195  bone]
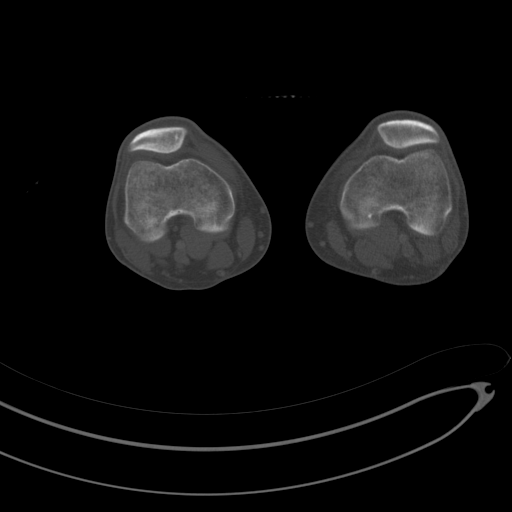
[im 32/195  soft-tissue]
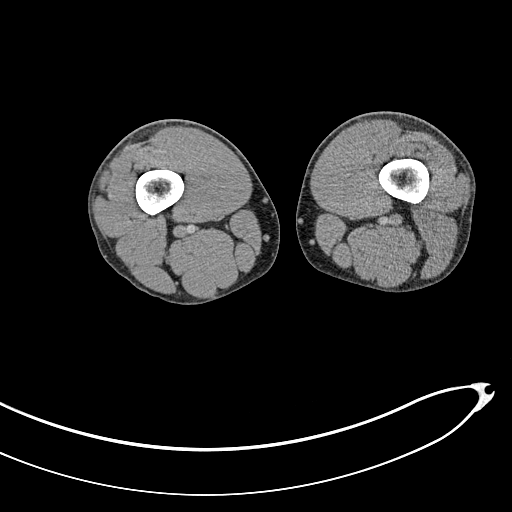
[im 44/195  soft-tissue]
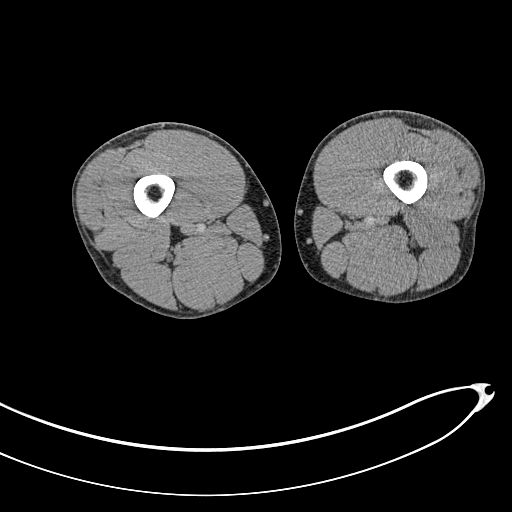
[im 63/195  soft-tissue]
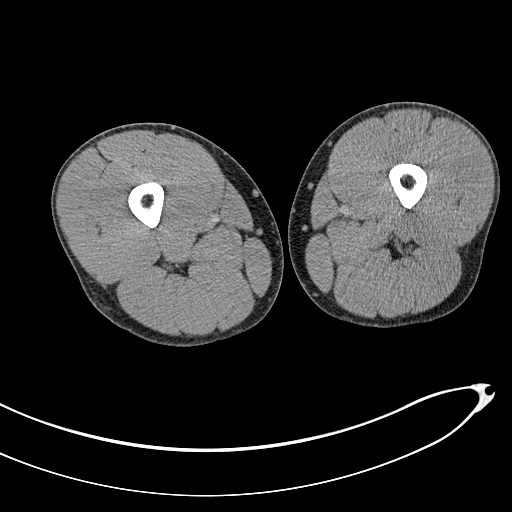
[im 82/195  soft-tissue]
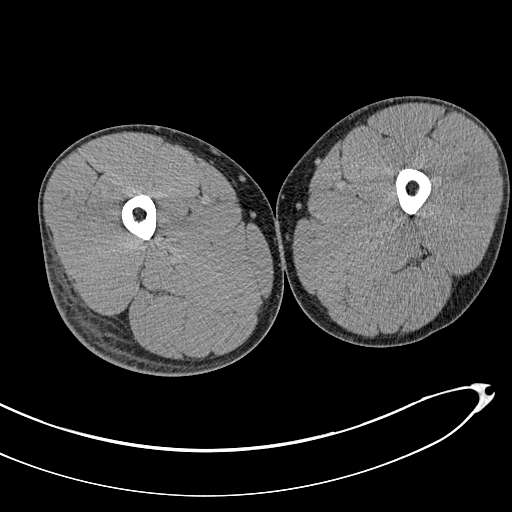
[im 101/195  soft-tissue]
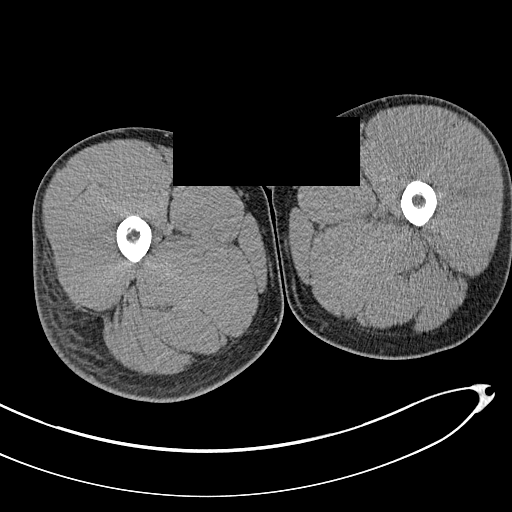
[im 113/195  soft-tissue]
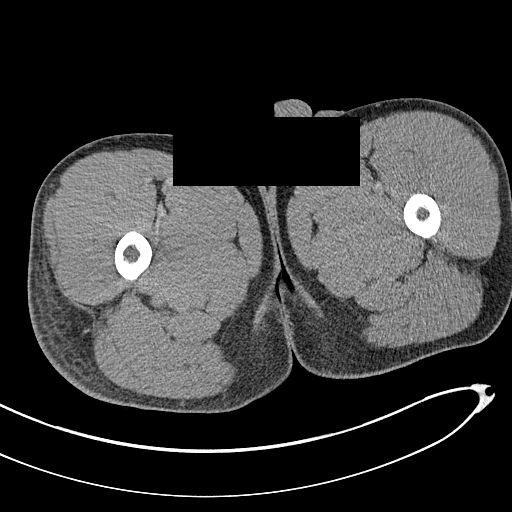
[im 132/195  soft-tissue]
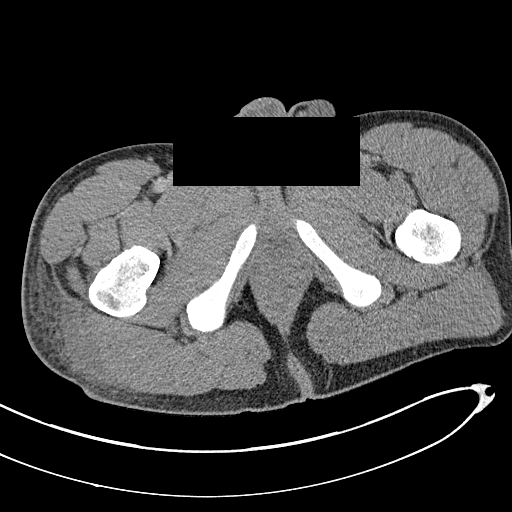
[im 151/195  soft-tissue]
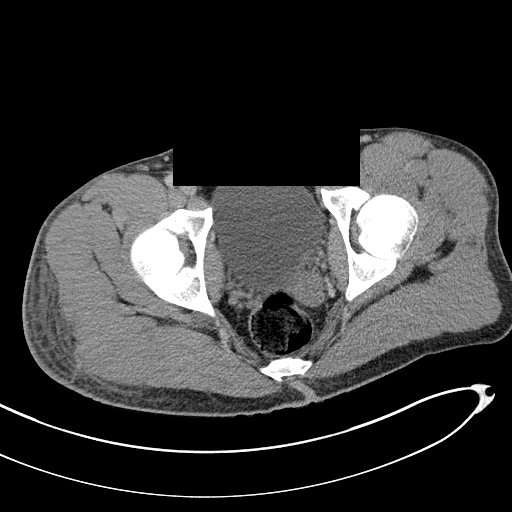
[im 151/195  bone]
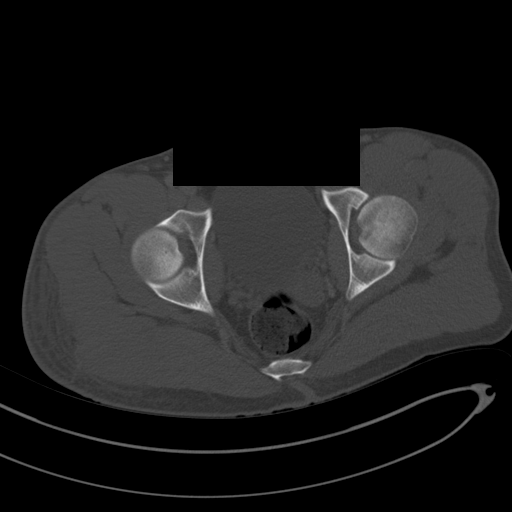
[im 163/195  soft-tissue]
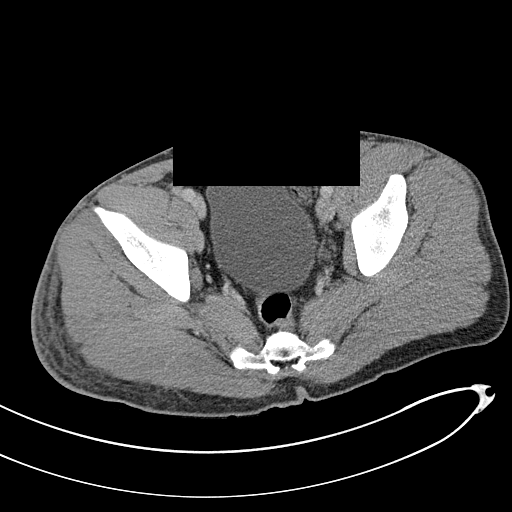
[im 182/195  soft-tissue]
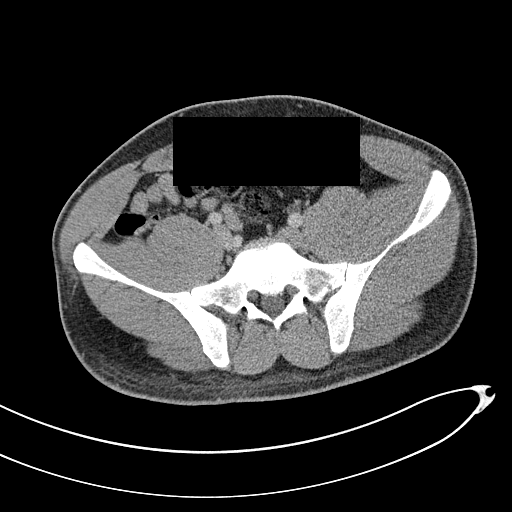

[Series 10: coronal st · coronal · 0.51mm/px · 3 of 127 slices shown]
[im 32/127  soft-tissue]
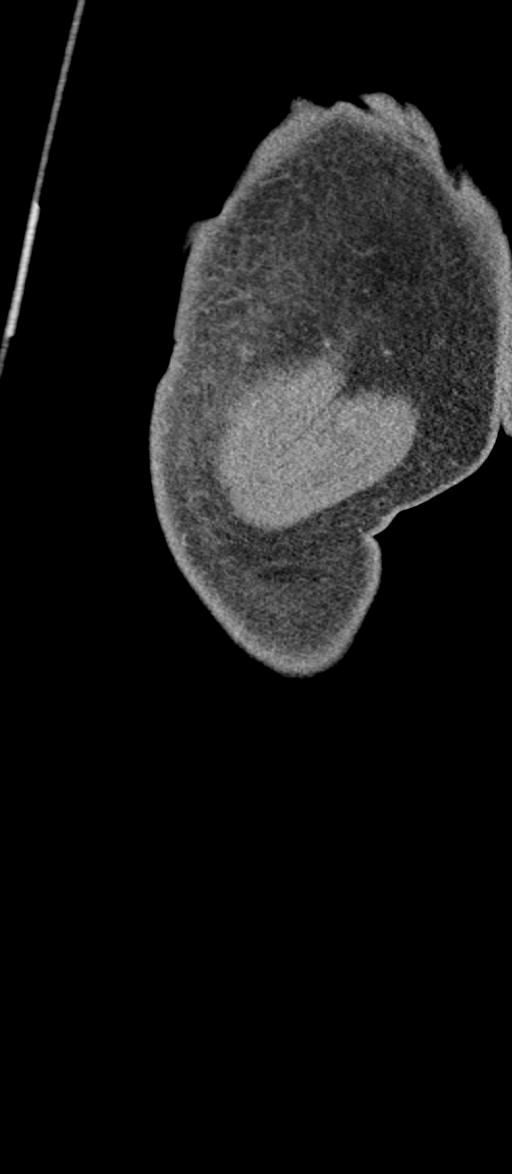
[im 64/127  soft-tissue]
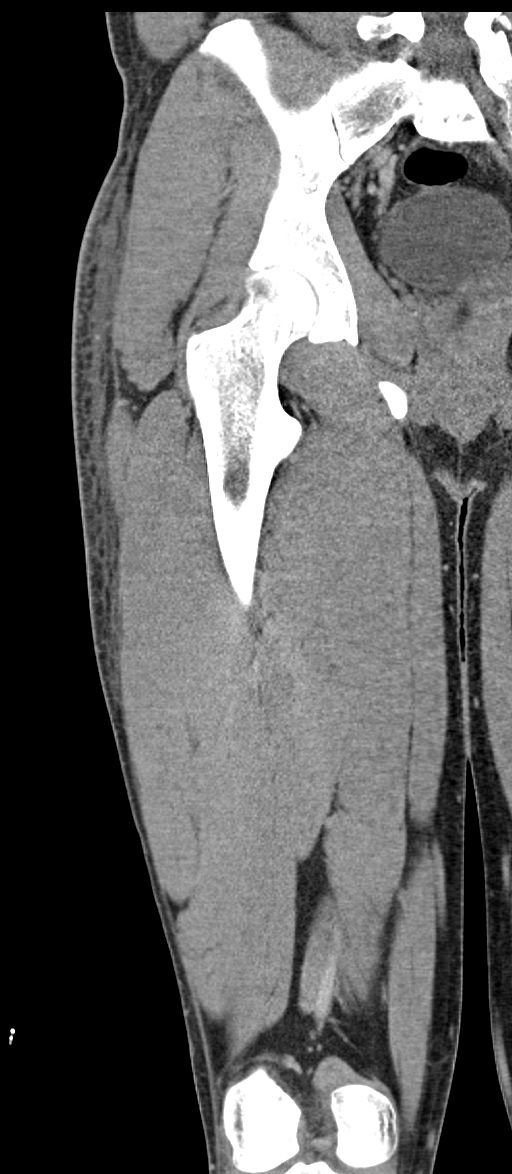
[im 95/127  soft-tissue]
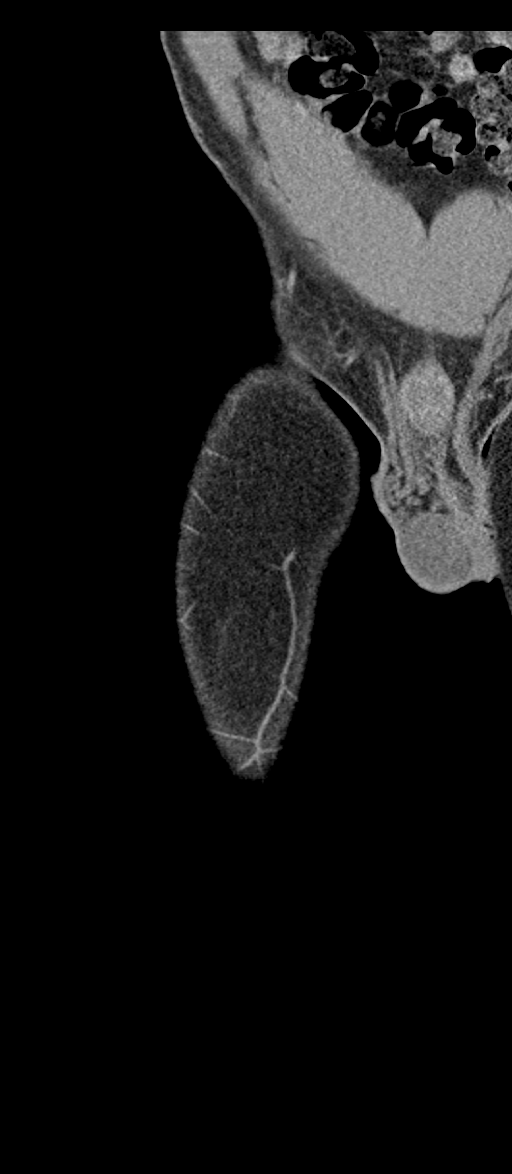

[14 of 46 positions shown; findings below may reference images not displayed]

FINDINGS: Bones/Joint/Cartilage

No fracture or dislocation. Normal alignment. No joint effusion.

Ligaments

Ligaments are suboptimally evaluated by CT.

Muscles and Tendons
No intramuscular fluid collection or hematoma. No muscle atrophy.
Mild hypodensity in the periphery of the right gluteus maximus
muscle deep to the inflammatory changes in the subcutaneous fat.

Soft tissue
No fluid collection or hematoma. No soft tissue mass. Hazy
inflammatory changes in the subcutaneous fat overlying the right
gluteus maximus muscle.
IMPRESSION: 1. Hazy inflammatory changes in the subcutaneous fat overlying the
right gluteus maximus muscle concerning for cellulitis. Adjacent to
the area cellulitis, there is mild hypodensity in the right gluteus
maximus muscle peripherally which may be reactive versus secondary
to mild myositis. No focal fluid collection to suggest an abscess.

## 2018-11-09 ENCOUNTER — Encounter (HOSPITAL_COMMUNITY): Payer: Self-pay | Admitting: Behavioral Health

## 2018-11-09 ENCOUNTER — Other Ambulatory Visit: Payer: Self-pay

## 2018-11-09 ENCOUNTER — Inpatient Hospital Stay (HOSPITAL_COMMUNITY)
Admission: RE | Admit: 2018-11-09 | Discharge: 2018-11-12 | DRG: 885 | Disposition: A | Discharge status: Home / Self Care | Payer: 59 | Attending: Psychiatry | Admitting: Psychiatry

## 2018-11-09 ENCOUNTER — Inpatient Hospital Stay (HOSPITAL_COMMUNITY): Payer: 59

## 2018-11-09 DIAGNOSIS — J320 Chronic maxillary sinusitis: Secondary | ICD-10-CM | POA: Diagnosis present

## 2018-11-09 DIAGNOSIS — Z808 Family history of malignant neoplasm of other organs or systems: Secondary | ICD-10-CM

## 2018-11-09 DIAGNOSIS — F4000 Agoraphobia, unspecified: Secondary | ICD-10-CM | POA: Diagnosis present

## 2018-11-09 DIAGNOSIS — F419 Anxiety disorder, unspecified: Secondary | ICD-10-CM

## 2018-11-09 DIAGNOSIS — J101 Influenza due to other identified influenza virus with other respiratory manifestations: Secondary | ICD-10-CM | POA: Diagnosis not present

## 2018-11-09 DIAGNOSIS — Z833 Family history of diabetes mellitus: Secondary | ICD-10-CM

## 2018-11-09 DIAGNOSIS — Z8249 Family history of ischemic heart disease and other diseases of the circulatory system: Secondary | ICD-10-CM

## 2018-11-09 DIAGNOSIS — Z79899 Other long term (current) drug therapy: Secondary | ICD-10-CM

## 2018-11-09 DIAGNOSIS — F200 Paranoid schizophrenia: Secondary | ICD-10-CM | POA: Diagnosis present

## 2018-11-09 DIAGNOSIS — F17220 Nicotine dependence, chewing tobacco, uncomplicated: Secondary | ICD-10-CM | POA: Diagnosis present

## 2018-11-09 DIAGNOSIS — R05 Cough: Secondary | ICD-10-CM | POA: Diagnosis not present

## 2018-11-09 DIAGNOSIS — H538 Other visual disturbances: Secondary | ICD-10-CM | POA: Diagnosis present

## 2018-11-09 DIAGNOSIS — Z87828 Personal history of other (healed) physical injury and trauma: Secondary | ICD-10-CM

## 2018-11-09 DIAGNOSIS — R45851 Suicidal ideations: Secondary | ICD-10-CM | POA: Diagnosis present

## 2018-11-09 DIAGNOSIS — F333 Major depressive disorder, recurrent, severe with psychotic symptoms: Secondary | ICD-10-CM | POA: Diagnosis not present

## 2018-11-09 DIAGNOSIS — R443 Hallucinations, unspecified: Secondary | ICD-10-CM | POA: Diagnosis not present

## 2018-11-09 HISTORY — DX: Mental disorder, not otherwise specified: F99

## 2018-11-09 HISTORY — DX: Unspecified disorder of adult personality and behavior: F69

## 2018-11-09 MED ORDER — TRAZODONE HCL 50 MG PO TABS: 50.0000 mg | ORAL_TABLET | Freq: Every evening | ORAL | Status: DC | PRN

## 2018-11-09 MED ORDER — ESCITALOPRAM OXALATE 5 MG PO TABS
5.0000 mg | ORAL_TABLET | Freq: Every day | ORAL | Status: DC
  Administered 2018-11-09 – 2018-11-11 (×3): 5 mg via ORAL
  Filled 2018-11-09 (×5): qty 1

## 2018-11-09 MED ORDER — ALUM & MAG HYDROXIDE-SIMETH 200-200-20 MG/5ML PO SUSP: 30.0000 mL | ORAL | Status: DC | PRN

## 2018-11-09 MED ORDER — HYDROXYZINE HCL 25 MG PO TABS
25.0000 mg | ORAL_TABLET | Freq: Four times a day (QID) | ORAL | Status: DC | PRN
  Administered 2018-11-09: 25 mg via ORAL
  Filled 2018-11-09: qty 1

## 2018-11-09 MED ORDER — ACETAMINOPHEN 325 MG PO TABS: 650.0000 mg | ORAL_TABLET | Freq: Four times a day (QID) | ORAL | Status: DC | PRN

## 2018-11-09 MED ORDER — MAGNESIUM HYDROXIDE 400 MG/5ML PO SUSP
30.0000 mL | Freq: Every day | ORAL | Status: DC | PRN
  Filled 2018-11-09: qty 30

## 2018-11-09 MED ORDER — QUETIAPINE FUMARATE 50 MG PO TABS
50.0000 mg | ORAL_TABLET | Freq: Every day | ORAL | Status: DC
  Administered 2018-11-09 – 2018-11-11 (×3): 50 mg via ORAL
  Filled 2018-11-09 (×5): qty 1

## 2018-11-09 MED ORDER — VALACYCLOVIR HCL 500 MG PO TABS
1000.0000 mg | ORAL_TABLET | Freq: Every day | ORAL | Status: DC
  Filled 2018-11-09 (×5): qty 2

## 2018-11-09 MED ORDER — ENSURE ENLIVE PO LIQD
237.0000 mL | Freq: Two times a day (BID) | ORAL | Status: DC
  Administered 2018-11-09: 237 mL via ORAL
  Filled 2018-11-09: qty 237

## 2018-11-09 NOTE — Progress Notes (Signed)
Nutrition Brief Note  Patient identified on the Malnutrition Screening Tool (MST) Report  Wt Readings from Last 15 Encounters:  11/09/18 95.7 kg  09/07/18 96.6 kg  05/09/18 95.5 kg  07/11/17 94.1 kg  07/11/17 94 kg  05/30/17 91.7 kg  04/20/17 87.7 kg  04/12/17 87.2 kg  12/09/15 92.4 kg  12/09/15 92.1 kg  11/04/15 92.5 kg  01/26/15 93 kg  03/07/12 82.9 kg (90 %, Z= 1.29)*  12/01/11 81.6 kg (90 %, Z= 1.27)*   * Growth percentiles are based on CDC (Boys, 2-20 Years) data.    Body mass index is 30.28 kg/m. Patient meets criteria for obesity based on current BMI. Current weight is 210 lb and weight on 09/07/18 was 212 lb. This indicates 2 lb weight loss (1% body weight) in the past 2 months; not significant for time frame.   Current diet order is Heart Healthy. Labs and medications reviewed.   No nutrition interventions warranted at this time. If nutrition issues arise, please consult RD.     Jarome Matin, MS, RD, LDN, [facility] Inpatient Clinical Dietitian Pager # [phone] After hours/weekend pager # [phone]

## 2018-11-09 NOTE — H&P (Signed)
Behavioral Health Medical Screening Exam  Gregg Crawford is a 23 y.o. Caucasian male male with no known previous hx of mental illness or substance use disorder. He walked-in to the [facility] with his mother complaining of ongoing paranoid ideations, delusional thinking & visual hallucinations of seeing multiple Bobcats & demonic faces that no one else could see. Patient & mother deny any hx of mental illness in their family. Patient denies any drug or alcohol use issues. He denies any medical issues. He reports passive suicidal ideations without any intent or plan to hurt himself or anyone else. He is currently recommended for inpatient psychiatric hospitalization for further evaluation.  Total Time spent with patient: 30 minutes  Psychiatric Specialty Exam: Physical Exam  Constitutional: He is oriented to person, place, and time. He appears well-developed and well-nourished.  HENT:  Head: Normocephalic.  Eyes: Pupils are equal, round, and reactive to light.  Neck: Normal range of motion.  Cardiovascular: Normal rate.  Respiratory: Effort normal.  Genitourinary:    Genitourinary Comments: Deferred   Musculoskeletal: Normal range of motion.  Neurological: He is alert and oriented to person, place, and time.  Skin: Skin is warm and dry.    Review of Systems  Constitutional: Negative for malaise/fatigue.  HENT: Negative.   Eyes: Negative.   Respiratory: Negative.  Negative for cough and shortness of breath.   Cardiovascular: Negative.  Negative for chest pain and palpitations.  Gastrointestinal: Negative.  Negative for abdominal pain, heartburn, nausea and vomiting.  Genitourinary: Negative.   Musculoskeletal: Negative.   Skin: Negative.   Neurological: Negative.  Negative for dizziness and headaches.  Endo/Heme/Allergies: Negative.   Psychiatric/Behavioral: Positive for depression, hallucinations and suicidal ideas. Negative for memory loss and substance abuse. The patient is  nervous/anxious. The patient does not have insomnia.     Blood pressure 140/86, pulse 82, temperature 97.9 F (36.6 C), resp. rate 20, height 5\' 10"  (1.778 m), weight 95.7 kg.Body mass index is 30.28 kg/m.  General Appearance: Casual and Fairly Groomed  Eye Contact:  Good  Speech:  Clear and Coherent and Normal Rate  Volume:  Normal  Mood:  Anxious and Depressed  Affect:  Congruent  Thought Process:  Coherent, Goal Directed and Descriptions of Associations: Intact  Orientation:  Full (Time, Place, and Person)  Thought Content:  Delusions, Hallucinations: Auditory Visual and Paranoid Ideation  Suicidal Thoughts:  Yes.  without intent/plan  Homicidal Thoughts:  Denies  Memory:  Immediate;   Good Recent;   Good Remote;   Good  Judgement:  Fair  Insight:  Fair  Psychomotor Activity:  Normal  Concentration: Concentration: Good and Attention Span: Good  Recall:  Good  Fund of Knowledge:Fair  Language: Good  Akathisia:  NA  Handed:  Right  AIMS (if indicated):     Assets:  Communication Skills Desire for Improvement Social Support  Sleep:  NA   Musculoskeletal: Strength & Muscle Tone: within normal limits Gait & Station: normal Patient leans: N/A  Blood pressure 140/86, pulse 82, temperature 97.9 F (36.6 C), resp. rate 20, height 5\' 10"  (1.778 m), weight 95.7 kg.  Recommendations: Inpatient psychiatric admission for further evaluation & treatment.  Based on my evaluation the patient does not appear to have an emergency medical condition.  Lindell Spar, NP, PMHNP, FNP-BC 11/09/2018, 2:45 PM

## 2018-11-09 NOTE — Progress Notes (Addendum)
Patient ID: Gregg Crawford, male   DOB: 1996-03-25, 23 y.o.   MRN: [id number]  Admission Note  D) Patient admitted to the adult unit 400 hall. Patient is a 23 year old male who is voluntary walk-in. Patient is brought in by his mother with complaints of hallucinations, paranoia and depression. Patient reports SI thoughts for the last year with no specific plan. Patient denies HI. Patient reports he has been experiencing visual hallucinations including seeing over 200 bob cats outside his house and seeing demonic faces. Patient reports in the moment he believes his hallucinations to be true, but recognizes now it was not real. Patient also reports increased paranoia, believing his family to be "out to get me" this past year but states he now knows this isn't true. 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 Patient states he lives alone but has been staying with his mother due to his decompensation. Patient reports this has been going "since I was 14" but states he has always minimized his symptoms to his family. Patient reports other symptoms including racing thoughts, depression, and cycling through periods of insomnia and over-sleeping. Patient denies PMH except for herpes. Patient takes only Valtrex 1000 mg daily. Patient denies allergies to food/medicine. Patient denies legal issues. Patient denies abusing alcohol or drugs including marijuana. While here, patient wants to work on "being happy".  Skin assessment was completed and unremarkable except for an old scar to R shin and a tattoo to L arm. Patient belongings searched with no contraband found. No belongings locked up on admission. Vital signs obtained. Snacks and fluids offered.    A) Plan of care, unit policies and patient expectations were explained. Written consents obtained. Patient oriented to the unit and their room. Patient placed on standard q15 safety checks. Low fall risk precautions initiated and reviewed with  patient.   R) Patient is in no acute distress and verbalizes understanding of information provided. Patient with no concerns at this time. Patient contracts for safety with staff on the unit.
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
## 2018-11-09 NOTE — BH Assessment (Signed)
Assessment Note  Gregg Crawford is an 23 y.o. male who presented to [facility] with his mother as a walk-in.  Patient states that for the past two years he has been struggling with some delusional thinking.  Last week, patient was out hunting and felt like he was being pursued by a bobcat.  He states that he started seeing more bobcats and by the time that he got back to his house, he was seeing about two hundred bobcats around his home.  He contacted his parents who came to his home and could not see any bobcats.  Patient had taken pictures of the bobcats and other demonic things that he was seeing, but when he showed his family the pictures, they could not see what he was seeing. Patient states that since this incident that he has not been able to return to his home and has been staying with his girlfriend and his parents. Mother, Ceasar Lund, present with patient, states that patient is not himself.  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  She states that he has been somewhat incapacitated since last week.  Patient states that he has not been able to concentrate, he has been very paranoid and feels like his family is working against him.  He states that he has been seeing faces out the corner of his eyes.  He states that he has not been able to perform his job as well as usual and his productivity is half of what it should be. Patient states that he has been depressed for several year.  He states that he feels fatigued all the time.  Patient states that he also sold his last house prior to the one he is currently living in because he thought that it was haunted.  He states that he would hear voices in the house and he would see images in the house.  Patient states that he has good family support, friends and a girlfriend, but states that he feels alone all of the time.  Patient states that he has never been suicidal or homicidal. Patient does admit to a  history of drinking alcohol and states that he would normally drink 1-2 beers a day and states that he would drink up to ten beers on weekends.  He states that he has also smoked marijuana in the past, but states that he did not use it daily and he never used any large amounts.  Patient states that he has been drug and alcohol free for the past month. Patient states that he is sleeping ten hours per night, but feels fatigued all of the time and states that it is almost impossible to get out of bed.  Patient states that his appetite has not been good, but he denies any weight loss.   Patient presented as oriented and alert, his mood depressed.  His affect was appropriate to circumstance.  Patient's thoughts were organized and his memory was intact.  Patient did not appear to be responding to internal stimuli.  Patient's judgment, insight and impulse control were impaired. Patient was mildly anxious.  Patient continued to express during the assessment that he felt paranoid and delusional.  Patient was neat and clean and cooperative with the assessment process.   Diagnosis: F23 Brief Psychotic Disorder  Past Medical History:  Past Medical History:  Diagnosis Date  . Head injury 05/2010   reacurrent sxs dizziness, headaches,confusion had a  negative MRI and EEG  . Viral encephalitis     Past Surgical History:  Procedure Laterality Date  . FRACTURE SURGERY     fracture bilateral ankles    Family History:  Family History  Problem Relation Age of Onset  . Hypertension Father   . Diabetes Maternal Grandfather   . Early death Maternal Grandfather   . Hypertension Maternal Grandfather   . Heart disease Maternal Grandfather   . Melanoma Mother     Social History:  reports that he has never smoked. His smokeless tobacco use includes chew. He reports current alcohol use of about 10.0 standard drinks of alcohol per week. He reports current drug use. Frequency: 1.00 time per week. Drug:  Marijuana.  Additional Social History:  Alcohol / Drug Use Pain Medications: see MAR Prescriptions: see MAR Over the Counter: see MAR History of alcohol / drug use?: Yes Longest period of sobriety (when/how long): patient has not used any substances in a month Substance #1 Name of Substance 1: alcohol 1 - Age of First Use: unknown 1 - Amount (size/oz): 1-2 beers daily through the week and 10 a day on weekends 1 - Frequency: daily 1 - Duration: since onset 1 - Last Use / Amount: 10/13/18 Substance #2 Name of Substance 2: THC 2 - Age of First Use: 13 2 - Amount (size/oz): a joint 2 - Frequency: 1-2 times a month 2 - Duration: since onset 2 - Last Use / Amount: 30 days ago  CIWA: CIWA-Ar BP: 140/86 Pulse Rate: 82 COWS:    Allergies: No Known Allergies  Home Medications:  Medications Prior to Admission  Medication Sig Dispense Refill  . triamcinolone ointment (KENALOG) 0.1 % Apply 1 application topically 2 (two) times daily. 90 g 1  . valACYclovir (VALTREX) 1000 MG tablet Take 1 tablet (1,000 mg total) by mouth daily. 30 tablet 6    OB/GYN Status:  No LMP for male patient.  General Assessment Data Location of Assessment: [facility] Assessment Services TTS Assessment: In system Is this a Tele or Face-to-Face Assessment?: Face-to-Face Is this an Initial Assessment or a Re-assessment for this encounter?: Initial Assessment Patient Accompanied by:: Parent Language Other than English: No Living Arrangements: Other (Comment)(has his own home) What gender do you identify as?: Male Marital status: Single Living Arrangements: Alone Can pt return to current living arrangement?: Yes Admission Status: Voluntary Is patient capable of signing voluntary admission?: Yes Referral Source: Self/Family/Friend Insurance type: [facility]     Crisis Care Plan Living Arrangements: Alone Legal Guardian: Other:(other) Name of Psychiatrist: none Name of Therapist: none  Education Status Is  patient currently in school?: No Is the patient employed, unemployed or receiving disability?: Employed  Risk to self with the past 6 months Suicidal Ideation: No Has patient been a risk to self within the past 6 months prior to admission? : No Suicidal Intent: No Has patient had any suicidal intent within the past 6 months prior to admission? : No Is patient at risk for suicide?: No Suicidal Plan?: No Has patient had any suicidal plan within the past 6 months prior to admission? : No Access to Means: No What has been your use of drugs/alcohol within the last 12 months?: hx of thc and alcohol use Previous Attempts/Gestures: No How many times?: 0 Other Self Harm Risks: none Triggers for Past Attempts: None known Intentional Self Injurious Behavior: None Family Suicide History: No Recent stressful life event(s): Other (Comment)(has 31 old baby) Persecutory voices/beliefs?: No Depression: Yes Depression  Symptoms: Despondent, Isolating, Fatigue, Loss of interest in usual pleasures Substance abuse history and/or treatment for substance abuse?: No Suicide prevention information given to non-admitted patients: Not applicable  Risk to Others within the past 6 months Homicidal Ideation: No Does patient have any lifetime risk of violence toward others beyond the six months prior to admission? : No Thoughts of Harm to Others: No Current Homicidal Intent: No Current Homicidal Plan: No Access to Homicidal Means: No Identified Victim: none History of harm to others?: No Assessment of Violence: None Noted Violent Behavior Description: none Does patient have access to weapons?: Yes (Comment) Criminal Charges Pending?: No Does patient have a court date: No Is patient on probation?: No  Psychosis Hallucinations: None noted Delusions: (visual delusions and paranoid thoughts)  Mental Status Report Appearance/Hygiene: Unremarkable Eye Contact: Good Motor Activity: Freedom of  movement Speech: Logical/coherent Level of Consciousness: Alert Mood: Depressed Affect: Anxious, Depressed Anxiety Level: Moderate Thought Processes: Coherent, Relevant Judgement: Impaired Orientation: Person, Place, Time, Situation Obsessive Compulsive Thoughts/Behaviors: None  Cognitive Functioning Concentration: Decreased Memory: Recent Intact, Remote Intact Is patient IDD: No Insight: Fair Impulse Control: Fair Appetite: Poor Have you had any weight changes? : (unsure) Sleep: No Change Total Hours of Sleep: 10 Vegetative Symptoms: None  ADLScreening [facility] Assessment Services) Patient's cognitive ability adequate to safely complete daily activities?: Yes Patient able to express need for assistance with ADLs?: Yes Independently performs ADLs?: Yes (appropriate for developmental age)  Prior Inpatient Therapy Prior Inpatient Therapy: No  Prior Outpatient Therapy Prior Outpatient Therapy: No Does patient have an ACCT team?: No Does patient have Intensive In-House Services?  : No Does patient have [facility] services? : No Does patient have P4CC services?: No  ADL Screening (condition at time of admission) Patient's cognitive ability adequate to safely complete daily activities?: Yes Is the patient deaf or have difficulty hearing?: No Does the patient have difficulty seeing, even when wearing glasses/contacts?: No Does the patient have difficulty concentrating, remembering, or making decisions?: No Patient able to express need for assistance with ADLs?: Yes Does the patient have difficulty dressing or bathing?: No Independently performs ADLs?: Yes (appropriate for developmental age) Does the patient have difficulty walking or climbing stairs?: No Weakness of Legs: None Weakness of Arms/Hands: None  Home Assistive Devices/Equipment Home Assistive Devices/Equipment: None  Therapy Consults (therapy consults require a physician order) PT Evaluation Needed: No OT  Evalulation Needed: No SLP Evaluation Needed: No Abuse/Neglect Assessment (Assessment to be complete while patient is alone) Abuse/Neglect Assessment Can Be Completed: Yes Physical Abuse: Denies Verbal Abuse: Denies Sexual Abuse: Denies Exploitation of patient/patient's resources: Denies Self-Neglect: Denies Values / Beliefs Cultural Requests During Hospitalization: None Spiritual Requests During Hospitalization: None Consults Spiritual Care Consult Needed: No Social Work Consult Needed: No Regulatory affairs officer (For Healthcare) Does Patient Have a Medical Advance Directive?: No Would patient like information on creating a medical advance directive?: No - Patient declined Nutrition Screen- [facility] Adult/[facility]/AP Has the patient recently lost weight without trying?: Yes, 2-13 lbs. Has the patient been eating poorly because of a decreased appetite?: Yes Malnutrition Screening Tool Score: 2        Disposition: Per Lindell Spar, NP, patient meets inpatient admission criteria. Disposition Initial Assessment Completed for this Encounter: Yes  On Site Evaluation by:   Reviewed with Physician:    Judeth Porch Gadge Hermiz 11/09/2018 12:42 PM

## 2018-11-09 NOTE — BHH Suicide Risk Assessment (Signed)
[facility] INPATIENT:  Family/Significant Other Suicide Prevention Education  Suicide Prevention Education:   Patient Refusal for Family/Significant Other Suicide Prevention Education: The patient Gregg Crawford has refused to provide written consent for family/significant other to be provided Family/Significant Other Suicide Prevention Education during admission and/or prior to discharge.  Physician notified.  SPE completed with patient, as patient refused to consent to family contact. SPI pamphlet provided to pt and pt was encouraged to share information with support network, ask questions, and talk about any concerns relating to SPE. Patient denies access to guns/firearms and verbalized understanding of information provided. Mobile Crisis information also provided to patient.    Marylee Floras 11/09/2018, 2:54 PM

## 2018-11-09 NOTE — BHH Counselor (Signed)
Adult Comprehensive Assessment  Patient ID: Gregg Crawford, male   DOB: 06-02-1996, 23 y.o.   MRN: [id number]  Information Source: Information source: Patient  Current Stressors:  Patient states their primary concerns and needs for treatment are:: "I was seeing a bunch of stuff and I have been severely depressed. 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, learning disability / Lack of housing: Lives alone in [place], [place]; Denies any stressors  Physical health (include injuries & life threatening diseases): Patient denies any current stressors  Social relationships: Patient denies any current stressors  Substance abuse: Patient denies any current substance use issues  Bereavement / Loss: Patient denies any current stressors   Living/Environment/Situation:  Living Arrangements: Alone Living conditions (as described by patient or guardian): "Good" Who else lives in the home?: Alone  How long has patient lived in current situation?: Since May 2019  What is atmosphere in current home: Comfortable  Family History:  Marital status: Single Are you sexually active?: Yes What is your sexual orientation?: Heterosexual  Has your sexual activity been affected by drugs, alcohol, medication, or emotional stress?: No  Does patient have children?: Yes How many children?: 1 How is patient's relationship with their children?: Patient reports having a close relationship with his 17 month old son.   Childhood History:  By whom was/is the  patient raised?: Both parents Additional childhood history information: Patient reports his parents seperated when he was 52 years old. Patient also states that his father is an alcoholic  Description of patient's relationship with caregiver when they were a child: Patient reports having a good relationship with his parents during his childhood.  Patient's description of current relationship with people who raised him/her: Patient reports he continues to have a good relationship with his mother, however he and his father no longer have a relationship.  How were you disciplined when you got in trouble as a child/adolescent?: Whoopings "If you did wrong, you knew the consequences"  Does patient have siblings?: Yes Number of Siblings: 2 Description of patient's current relationship with siblings: Patient reports that he has a good relationship with his two sisters  Did patient suffer any verbal/emotional/physical/sexual abuse as a child?: No Did patient suffer from severe childhood neglect?: No Has patient ever been sexually abused/assaulted/raped as an adolescent or adult?: No Was the patient ever a victim of a crime or a disaster?: No Witnessed domestic violence?: No Has patient been effected by domestic violence as an adult?: No  Education:  Highest grade of school patient has completed: 12th grade  Currently a student?: No Learning disability?: No  Employment/Work Situation:   Employment situation: Employed 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  How long has patient been employed?: 2 1/2 years  Patient's job has been impacted by current illness: Yes Describe how patient's job has been impacted: Patient reports he struggles concentrating due to his increasing symptoms  What is the longest time patient has a held a job?: 3 1/2 years  Where was the patient employed at that time?: [employer]  in [place], [place] Did You Receive Any Psychiatric  Treatment/Services While in the [place]?: No Are There Guns or Other Weapons in [place]?: Yes Types of Guns/Weapons: hunting rifles  Are These Weapons Safely Secured?: Yes  Financial Resources:   Financial resources: Income from employment Does patient have a representative payee or guardian?: No  Alcohol/Substance Abuse:   What has been your use of drugs/alcohol within the last 12 months?: Patient denies any substance use  If attempted suicide, did drugs/alcohol play a role in this?: No Alcohol/Substance Abuse Treatment Hx: Denies past history Has alcohol/substance abuse ever caused legal problems?: No  Social Support System:   Patient's Community Support System: Psychologist, prison and probation services Support System: "Family and friends"  Type of faith/religion: Christianity  How does patient's faith help to cope with current illness?: Engineering geologist:   Leisure and Hobbies: Location manager, mountain biking, exercising and fishing   Strengths/Needs:   What is the patient's perception of their strengths?: "Hard working" Patient states they can use these personal strengths during their treatment to contribute to their recovery: Yes  Patient states these barriers may affect/interfere with their treatment: No Patient states these barriers may affect their return to the community: No  Other important information patient would like considered in planning for their treatment: No   Discharge Plan:   Patient states concerns and preferences for aftercare planning are: Patient declined referrals at this time. He states that he wants to take it one step at a time  Patient states they will know when they are safe and ready for discharge when: Yes, once he learns his diagnosis and more information regarding his diagnosis  Does patient have access to transportation?: Yes Does patient have financial barriers related to discharge medications?: No Will patient be returning to same living situation after  discharge?: Yes  Summary/Recommendations:   Summary and Recommendations (to be completed by the evaluator): Gregg Crawford is a 23 year old male who is diagnosed with Brief Psychotic Disorder. He presented to the hospital seeking treatment after he has been struggling with  delusional thinking. During the assessment, Reakwon was pleasant and cooperative with providing information. Vedh reports experiencing visual hallucinations earlier this week and states that he decided to come to the hospital to seek answers. Dacoda states that he wants to obtain more answers in regards to his diagnosis and treatment options. Snyder can benefit from crisis stabilization, medication management, therapeutic milieu and referral services.   Marylee Floras. 11/09/2018

## 2018-11-09 NOTE — Progress Notes (Addendum)
D: Pt was in dayroom upon initial approach.  Pt presents with depressed affect and mood.  He reports his day was "all right" and denies having goal.  Writer and pt made goal for pt to be safe and sleep well.  Pt denies SI/HI, denies hallucinations, denies pain.  Pt has been visible in milieu interacting with peers and staff appropriately.  Pt attended evening group.    A: Introduced self to pt.  Met with pt 1:1.  Actively listened to pt and offered support and encouragement.  Medications offered per order.  Q15 minute safety checks maintained.  R: Pt is safe on the unit.  Pt is compliant with medication except for Valtrex, which he reports he was advised not to take by MD earlier today.  Pt verbally contracts for safety.  Will continue to monitor and assess.
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
## 2018-11-09 NOTE — Progress Notes (Signed)
Adult Psychoeducational Group Note  Date:  11/09/2018 Time:  8:53 PM  Group Topic/Focus:  Wrap-Up Group:   The focus of this group is to help patients review their daily goal of treatment and discuss progress on daily workbooks.  Participation Level:  Active  Participation Quality:  Appropriate  Affect:  Appropriate  Cognitive:  Alert  Insight: Appropriate  Engagement in Group:  Engaged  Modes of Intervention:  Discussion  Additional Comments:  Patient stated having a good day.  Doyle Tegethoff L Delon Revelo 11/09/2018, 8:53 PM

## 2018-11-09 NOTE — Tx Team (Signed)
Initial Treatment Plan 11/09/2018 3:11 PM Gregg Crawford:[id number]    PATIENT STRESSORS: Health problems   PATIENT STRENGTHS: Average or above average intelligence Capable of independent living General fund of knowledge Motivation for treatment/growth   PATIENT IDENTIFIED PROBLEMS: "be happy"  Suicide Risk   Psychosis  Depression               DISCHARGE CRITERIA:  Ability to meet basic life and health needs Adequate post-discharge living arrangements Improved stabilization in mood, thinking, and/or behavior Medical problems require only outpatient monitoring Motivation to continue treatment in a less acute level of care Need for constant or close observation no longer present Reduction of life-threatening or endangering symptoms to within safe limits Safe-care adequate arrangements made Verbal commitment to aftercare and medication compliance  PRELIMINARY DISCHARGE PLAN: Outpatient therapy  PATIENT/FAMILY INVOLVEMENT: This treatment plan has been presented to and reviewed with the patient, Gregg Crawford.  The patient and family have been given the opportunity to ask questions and make suggestions.  Annia Friendly, RN 11/09/2018, 3:11 PM

## 2018-11-10 LAB — CBC
HCT: 49.4 % (ref 39.0–52.0)
HEMOGLOBIN: 16.1 g/dL (ref 13.0–17.0)
MCH: 30.9 pg (ref 26.0–34.0)
MCHC: 32.6 g/dL (ref 30.0–36.0)
MCV: 94.8 fL (ref 80.0–100.0)
Platelets: 227 10*3/uL (ref 150–400)
RBC: 5.21 MIL/uL (ref 4.22–5.81)
RDW: 11.7 % (ref 11.5–15.5)
WBC: 4.7 10*3/uL (ref 4.0–10.5)
nRBC: 0 % (ref 0.0–0.2)

## 2018-11-10 LAB — LIPID PANEL
Cholesterol: 189 mg/dL (ref 0–200)
HDL: 42 mg/dL (ref >40)
LDL Cholesterol: 93 mg/dL (ref 0–99)
Total CHOL/HDL Ratio: 4.5 RATIO
Triglycerides: 272 mg/dL — ABNORMAL HIGH (ref <150)
VLDL: 54 mg/dL — ABNORMAL HIGH (ref 0–40)

## 2018-11-10 LAB — COMPREHENSIVE METABOLIC PANEL
ALK PHOS: 72 U/L (ref 38–126)
ALT: 80 U/L — ABNORMAL HIGH (ref 0–44)
ANION GAP: 10 (ref 5–15)
AST: 40 U/L (ref 15–41)
Albumin: 4.1 g/dL (ref 3.5–5.0)
BILIRUBIN TOTAL: 0.8 mg/dL (ref 0.3–1.2)
BUN: 13 mg/dL (ref 6–20)
CO2: 28 mmol/L (ref 22–32)
Calcium: 9.4 mg/dL (ref 8.9–10.3)
Chloride: 103 mmol/L (ref 98–111)
Creatinine, Ser: 1.28 mg/dL — ABNORMAL HIGH (ref 0.61–1.24)
GFR calc non Af Amer: >60 mL/min (ref >60)
Glucose, Bld: 92 mg/dL (ref 70–99)
Potassium: 3.8 mmol/L (ref 3.5–5.1)
SODIUM: 141 mmol/L (ref 135–145)
Total Protein: 7.1 g/dL (ref 6.5–8.1)

## 2018-11-10 LAB — HEMOGLOBIN A1C
HEMOGLOBIN A1C: 5.1 % (ref 4.8–5.6)
MEAN PLASMA GLUCOSE: 99.67 mg/dL

## 2018-11-10 LAB — RAPID URINE DRUG SCREEN, HOSP PERFORMED
AMPHETAMINES: NOT DETECTED
BARBITURATES: NOT DETECTED
BENZODIAZEPINES: NOT DETECTED
COCAINE: NOT DETECTED
Opiates: NOT DETECTED
Tetrahydrocannabinol: NOT DETECTED

## 2018-11-10 LAB — URINALYSIS, ROUTINE W REFLEX MICROSCOPIC
BILIRUBIN URINE: NEGATIVE
Glucose, UA: NEGATIVE mg/dL
HGB URINE DIPSTICK: NEGATIVE
Ketones, ur: NEGATIVE mg/dL
Leukocytes, UA: NEGATIVE
Nitrite: NEGATIVE
PROTEIN: NEGATIVE mg/dL
Specific Gravity, Urine: 1.013 (ref 1.005–1.030)
pH: 7 (ref 5.0–8.0)

## 2018-11-10 LAB — TSH: TSH: 1.013 u[IU]/mL (ref 0.350–4.500)

## 2018-11-10 LAB — ETHANOL: Alcohol, Ethyl (B): <10 mg/dL (ref <10)

## 2018-11-10 NOTE — Progress Notes (Signed)
D: Pt was in hallway upon initial approach.  Pt presents with appropriate affect and mood.  He reports his day was "pretty good" and he had a good visit with his mother and step father tonight.  His goal is to "be happy, get my mind right."  Pt denies SI/HI, denies hallucinations, denies pain.  Pt has been visible in milieu interacting with peers and staff appropriately.  Pt was seen smiling and laughing at times in dayroom.  Pt attended evening group.    A:  Met with pt 1:1.  Actively listened to pt and offered support and encouragement. Medications administered per order.  Q15 minute safety checks maintained.  R: Pt is safe on the unit.  Pt is compliant with medication.  Pt verbally contracts for safety.  Will continue to monitor and assess.
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
## 2018-11-10 NOTE — B[facility] Group Notes (Signed)
Adult Psychoeducational Group Note  Date:  11/10/2018 Time:  9:25 PM  Group Topic/Focus:  Wrap-Up Group:   The focus of this group is to help patients review their daily goal of treatment and discuss progress on daily workbooks.  Participation Level:  Active  Participation Quality:  Appropriate  Affect:  Appropriate  Cognitive:  Appropriate  Insight: Good  Engagement in Group:  Engaged  Modes of Intervention:  Discussion  Additional Comments:  Pt stated that he had a good day because he played basketball and that he rated it an 8.  Pt also stated that he could not remember what his goal was for  Today.  Antrell Tipler A 11/10/2018, 9:25 PM

## 2018-11-10 NOTE — Progress Notes (Signed)
Patient ID: Gregg Crawford, male   DOB: 14-Mar-1996, 23 y.o.   MRN: [id number]  Pt currently presents with an anxious affect and cooperative behavior. Pt reports to writer that their goal is to "figure out why my head isn't clear." Pt states "I can't seem to focus." Reports periods where he "blanks." Pt remains in the dayroom today, has limited interactions with others. Pt reports okay sleep with current medication regimen.   Pt provided with medications per providers orders. Pt's labs and vitals were monitored throughout the night. Pt supported emotionally and encouraged to express concerns and questions. Pt educated on medications and CBT techniques, needs reinforcement.   Pt's safety ensured with 15 minute and environmental checks. Pt currently denies SI/HI and A/V hallucinations. Pt verbally agrees to seek staff if SI/HI or A/VH occurs and to consult with staff before acting on any harmful thoughts. Will continue POC.

## 2018-11-11 LAB — PROLACTIN: Prolactin: 25.6 ng/mL — ABNORMAL HIGH (ref 4.0–15.2)

## 2018-11-11 MED ORDER — ESCITALOPRAM OXALATE 10 MG PO TABS
10.0000 mg | ORAL_TABLET | Freq: Every day | ORAL | Status: DC
  Administered 2018-11-12: 10 mg via ORAL
  Filled 2018-11-11 (×2): qty 1

## 2018-11-11 MED ORDER — NICOTINE 21 MG/24HR TD PT24
21.0000 mg | MEDICATED_PATCH | Freq: Every day | TRANSDERMAL | Status: DC
  Administered 2018-11-11: 21 mg via TRANSDERMAL
  Filled 2018-11-11 (×2): qty 1
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
## 2018-11-11 NOTE — Plan of Care (Signed)
D: Pt presents with a flat affect and a depressed mood. Pt appeared to be guarded on approach and forwarded little information. Pt rated on his self inventory sheet: depression 4/10, anxiety 3/10 and hopelessness 3/10. Pt denies SI/HI. Pt reported good sleep last night, good appetite today and poor concentration today. Pt denies having any AVH or delusions. Pt compliant with taking medications and denies any side effects to medications. Pt compliant with attending scheduled groups.  A: Medications reviewed with pt. Medications administered as ordered per MD. Verbal support provided. Nicotine patch ordered at the pt's request. V/s assessed. 15 minute checks performed for safety.  R: Pt compliant with tx plan. No concerns verbalized by pt at this time.   Care-Plan Problem: Activity: Goal: Interest or engagement in activities will improve Outcome: Progressing   Problem: Coping: Goal: Ability to demonstrate self-control will improve Outcome: Progressing

## 2018-11-11 NOTE — B[facility] Group Notes (Signed)
[place] LCSW Group Therapy Note  11/11/2018   10:00-11:00AM  Type of Therapy and Topic:  Group Therapy:  Unhealthy versus Healthy Supports, Which Am I?  Participation Level:  Minimal   Description of Group:  Patients in this group were introduced to the concept that additional supports including self-support are an essential part of recovery.  Initially a discussion was held about the differences between healthy versus unhealthy supports.  Patients were asked to share what unhealthy supports in their lives need to be addressed, as well as what additional healthy supports could be added for greater help in reaching their goals.   A song entitled "My Own Hero" was played and a group discussion ensued in which patients stated they could relate to the song and it inspired them to realize they have be willing to help themselves in order to succeed, because other people cannot achieve sobriety or stability for them.  We discussed adding a variety of healthy supports to address the various needs in patient lives, including becoming more self-supportive.  Therapeutic Goals: 1)  Highlight the differences between healthy and unhealthy supports 2)  Suggest the importance of being a part of one's own support system 2)  Discuss reasons people in one's life may eventually be unable to be continually supportive  3)  Identify the patient's current support system and   4)  elicit commitments to add healthy supports and to become more conscious of being self-supportive   Summary of Patient Progress:  The patient expressed that the unhealthy support which needs to be addressed includes his "baby mama."  Healthy supports which could be added for increased stability and happiness include "I don't know."  He was not engaged in group at all, other than when other patients teased him.  Therapeutic Modalities:   Motivational Interviewing Activity  Maretta Los

## 2018-11-12 ENCOUNTER — Encounter (HOSPITAL_COMMUNITY): Payer: Self-pay

## 2018-11-12 ENCOUNTER — Inpatient Hospital Stay (HOSPITAL_COMMUNITY): Payer: 59

## 2018-11-12 ENCOUNTER — Other Ambulatory Visit: Payer: Self-pay

## 2018-11-12 DIAGNOSIS — F333 Major depressive disorder, recurrent, severe with psychotic symptoms: Secondary | ICD-10-CM

## 2018-11-12 LAB — INFLUENZA PANEL BY PCR (TYPE A & B)
Influenza A By PCR: NEGATIVE
Influenza B By PCR: POSITIVE — AB

## 2018-11-12 LAB — COMPREHENSIVE METABOLIC PANEL
ALT: 74 U/L — ABNORMAL HIGH (ref 0–44)
AST: 47 U/L — ABNORMAL HIGH (ref 15–41)
Albumin: 4.3 g/dL (ref 3.5–5.0)
Alkaline Phosphatase: 67 U/L (ref 38–126)
Anion gap: 10 (ref 5–15)
BUN: 10 mg/dL (ref 6–20)
CO2: 23 mmol/L (ref 22–32)
Calcium: 8.9 mg/dL (ref 8.9–10.3)
Chloride: 103 mmol/L (ref 98–111)
Creatinine, Ser: 1.38 mg/dL — ABNORMAL HIGH (ref 0.61–1.24)
GFR calc Af Amer: >60 mL/min (ref >60)
GFR calc non Af Amer: >60 mL/min (ref >60)
Glucose, Bld: 89 mg/dL (ref 70–99)
Potassium: 4.2 mmol/L (ref 3.5–5.1)
Sodium: 136 mmol/L (ref 135–145)
Total Bilirubin: 1.1 mg/dL (ref 0.3–1.2)
Total Protein: 7.1 g/dL (ref 6.5–8.1)

## 2018-11-12 LAB — CBC WITH DIFFERENTIAL/PLATELET
Abs Immature Granulocytes: 0.01 10*3/uL (ref 0.00–0.07)
Basophils Absolute: 0 10*3/uL (ref 0.0–0.1)
Basophils Relative: 1 %
Eosinophils Absolute: 0 10*3/uL (ref 0.0–0.5)
Eosinophils Relative: 0 %
HCT: 48.2 % (ref 39.0–52.0)
Hemoglobin: 16.1 g/dL (ref 13.0–17.0)
IMMATURE GRANULOCYTES: 0 %
Lymphocytes Relative: 8 %
Lymphs Abs: 0.4 10*3/uL — ABNORMAL LOW (ref 0.7–4.0)
MCH: 31.4 pg (ref 26.0–34.0)
MCHC: 33.4 g/dL (ref 30.0–36.0)
MCV: 94 fL (ref 80.0–100.0)
Monocytes Absolute: 0.8 10*3/uL (ref 0.1–1.0)
Monocytes Relative: 18 %
NEUTROS PCT: 73 %
Neutro Abs: 3.2 10*3/uL (ref 1.7–7.7)
Platelets: 177 10*3/uL (ref 150–400)
RBC: 5.13 MIL/uL (ref 4.22–5.81)
RDW: 11.5 % (ref 11.5–15.5)
WBC: 4.3 10*3/uL (ref 4.0–10.5)
nRBC: 0.5 % — ABNORMAL HIGH (ref 0.0–0.2)

## 2018-11-12 LAB — CK: Total CK: 98 U/L (ref 49–397)

## 2018-11-12 MED ORDER — QUETIAPINE FUMARATE 50 MG PO TABS: 50.0000 mg | ORAL_TABLET | Freq: Every day | ORAL | Status: DC

## 2018-11-12 MED ORDER — OSELTAMIVIR PHOSPHATE 75 MG PO CAPS: 75.0000 mg | ORAL_CAPSULE | Freq: Two times a day (BID) | ORAL | 0 refills | Status: DC

## 2018-11-12 MED ORDER — ACETAMINOPHEN 325 MG PO TABS
650.0000 mg | ORAL_TABLET | Freq: Once | ORAL | Status: AC
  Administered 2018-11-12: 650 mg via ORAL
  Filled 2018-11-12: qty 2

## 2018-11-12 MED ORDER — ESCITALOPRAM OXALATE 10 MG PO TABS: 10.0000 mg | ORAL_TABLET | Freq: Every day | ORAL | 0 refills | Status: DC

## 2018-11-12 MED ORDER — QUETIAPINE FUMARATE 50 MG PO TABS: 50.0000 mg | ORAL_TABLET | Freq: Every day | ORAL | 0 refills | Status: DC

## 2018-11-12 MED ORDER — NICOTINE 21 MG/24HR TD PT24: 21.0000 mg | MEDICATED_PATCH | Freq: Every day | TRANSDERMAL | 0 refills | Status: DC

## 2018-11-12 MED ORDER — HYDROXYZINE HCL 25 MG PO TABS: 25.0000 mg | ORAL_TABLET | Freq: Four times a day (QID) | ORAL | 0 refills | Status: DC | PRN

## 2018-11-12 MED FILL — ESCITALOPRAM 10 MG TABLET: 10 | 30 days supply | Qty: 30 | Fill #0

## 2018-11-12 MED FILL — QUETIAPINE FUMARATE 50 MG T: 50 | 30 days supply | Qty: 30 | Fill #0

## 2018-11-12 MED FILL — OSELTAMIVIR PHOSPHATE 75 MG: 75 | 5 days supply | Qty: 10 | Fill #0

## 2018-11-12 MED FILL — hydrOXYzine HCL 25 MG TABS: 25 | 15 days supply | Qty: 60 | Fill #0

## 2018-11-12 NOTE — ED Notes (Signed)
Bed: NG23 Expected date:  Expected time:  Means of arrival:  Comments: Hold per charge

## 2018-11-12 NOTE — ED Notes (Signed)
FAMILY BROUGHT PT MEAL. DECLINED MEAL EARLIER BY THIS WRITER

## 2018-11-12 NOTE — Progress Notes (Signed)
  [facility] Adult Case Management Discharge Plan :  Will you be returning to the same living situation after discharge:  No. Patient is discharging home with his mother in [place], [place] At discharge, do you have transportation home?: Yes,  patient reports his mother is picking him up at discharge Do you have the ability to pay for your medications: Yes,  income from employment, [employer] [facility]  Release of information consent forms completed and in the chart;  Patient's signature needed at discharge.  Patient to Follow up at: Follow-up Information    [facility]. Go on 11/13/2018.   Why:  Your hospital follow up appointment is Tuesday, 1/14 at 11:00a.  Please bring: photo ID, proof of insurance, SSN, and discharge paperwork from this hospitalization.  Contact information: [address] [place] [address] [phone]           Next level of care provider has access to [place] and Suicide Prevention discussed: Yes,  with the patient  Have you used any form of tobacco in the last 30 days? (Cigarettes, Smokeless Tobacco, Cigars, and/or Pipes): No  Has patient been referred to the Quitline?: N/A patient is not a smoker  Patient has been referred for addiction treatment: [place] 11/12/2018, 3:54 PM
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
## 2018-11-12 NOTE — ED Provider Notes (Signed)
[facility] DEPT Provider Note   CSN: [id number] Arrival date & time: 11/12/18  1126     History   Chief Complaint Chief Complaint  Patient presents with  . Delusional  . Depression  . Fever    HPI Gregg Crawford is a 23 y.o. male.  Patient currently inpatient at behavioral health presents with complaint of fever.  Patient has had 1 day of fevers, cough, body aches especially in his back.  No sore throat.  He has an associated headache.  No vomiting or diarrhea.  He has not had a flu shot this year.  There is another patient at behavioral health currently with the flu.  Dr. Parke Poisson wanted to rule out any signs of neuroleptic malignant syndrome given recent initiation of Seroquel.  This is currently being held.  No urinary symptoms reported.  No treatments prior to arrival. The onset of this condition was acute. The course is constant. Aggravating factors: none. Alleviating factors: none.       Past Medical History:  Diagnosis Date  . Head injury 05/2010   reacurrent sxs dizziness, headaches,confusion had a negative MRI and EEG  . Psychiatric complaint   . Viral encephalitis     Patient Active Problem List   Diagnosis Date Noted  . Schizophrenia, paranoid type ([place]) 11/09/2018  . Cellulitis 07/12/2017  . Cellulitis of buttock 07/11/2017  . Umbilical hernia 04/25/9484  . 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 Physical exam 12/09/2015  . Spells 11/07/2015  . Near syncope 11/07/2015  . 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 Cough 12/01/2011  . Head injury, closed 12/01/2011  . Smokeless tobacco use 12/01/2011    Past Surgical History:  Procedure Laterality Date  . FRACTURE SURGERY     fracture bilateral ankles        Home Medications    Prior to Admission medications   Medication Sig Start Date End Date Taking? Authorizing Provider  valACYclovir (VALTREX) 1000 MG tablet Take 1 tablet (1,000 mg total) by mouth daily.  09/07/18   Midge Minium, MD    Family History Family History  Problem Relation Age of Onset  . Hypertension Father   . Diabetes Maternal Grandfather   . Early death Maternal Grandfather   . Hypertension Maternal Grandfather   . Heart disease Maternal Grandfather   . Melanoma Mother     Social History Social History   Tobacco Use  . Smoking status: Never Smoker  . Smokeless tobacco: Current User    Types: Chew  . Tobacco comment: dips  Substance Use Topics  . Alcohol use: Yes    Alcohol/week: 10.0 standard drinks    Types: 10 Cans of beer per week    Comment: was drinking 1-2 beers daily, last drink was 10/13/18  . Drug use: Yes    Frequency: 1.0 times per week    Types: Marijuana    Comment: no use of marijuana in a month     Allergies   Patient has no known allergies.   Review of Systems Review of Systems  Constitutional: Positive for fever.  HENT: Negative for rhinorrhea and sore throat.   Eyes: Negative for redness.  Respiratory: Positive for cough. Negative for shortness of breath.   Cardiovascular: Negative for chest pain.  Gastrointestinal: Negative for abdominal pain, diarrhea, nausea and vomiting.  Genitourinary: Negative for dysuria.  Musculoskeletal: Positive for myalgias.  Skin: Negative for rash.  Neurological: Positive for headaches.     Physical Exam  Updated Vital Signs BP 128/86 (BP Location: Right Arm)   Pulse (!) 101   Temp 100.2 F (37.9 C) (Oral)   Resp 18   Ht 5\' 10"  (1.778 m)   Wt 95.7 kg   SpO2 99%   BMI 30.27 kg/m   Physical Exam Vitals signs and nursing note reviewed.  Constitutional:      Appearance: He is well-developed.  HENT:     Head: Normocephalic and atraumatic.  Eyes:     General:        Right eye: No discharge.        Left eye: No discharge.     Conjunctiva/sclera: Conjunctivae normal.  Neck:     Musculoskeletal: Normal range of motion and neck supple.  Cardiovascular:     Rate and Rhythm: Regular  rhythm. Tachycardia present.     Heart sounds: Normal heart sounds.     Comments: Mild tachycardia Pulmonary:     Effort: Pulmonary effort is normal.     Breath sounds: Normal breath sounds.  Abdominal:     Palpations: Abdomen is soft.     Tenderness: There is no abdominal tenderness.  Skin:    General: Skin is warm and dry.  Neurological:     Mental Status: He is alert.      ED Treatments / Results  Labs (all labs ordered are listed, but only abnormal results are displayed) Labs Reviewed  COMPREHENSIVE METABOLIC PANEL - Abnormal; Notable for the following components:      Result Value   Creatinine, Ser 1.28 (*)    ALT 80 (*)    All other components within normal limits  LIPID PANEL - Abnormal; Notable for the following components:   Triglycerides 272 (*)    VLDL 54 (*)    All other components within normal limits  PROLACTIN - Abnormal; Notable for the following components:   Prolactin 25.6 (*)    All other components within normal limits  INFLUENZA PANEL BY PCR (TYPE A & B) - Abnormal; Notable for the following components:   Influenza B By PCR POSITIVE (*)    All other components within normal limits  CBC WITH DIFFERENTIAL/PLATELET - Abnormal; Notable for the following components:   nRBC 0.5 (*)    Lymphs Abs 0.4 (*)    All other components within normal limits  COMPREHENSIVE METABOLIC PANEL - Abnormal; Notable for the following components:   Creatinine, Ser 1.38 (*)    AST 47 (*)    ALT 74 (*)    All other components within normal limits  RAPID URINE DRUG SCREEN, HOSP PERFORMED  URINALYSIS, ROUTINE W REFLEX MICROSCOPIC  CBC  HEMOGLOBIN A1C  ETHANOL  TSH  CK    EKG None  Radiology No results found.  Procedures Procedures (including critical care time)  Medications Ordered in ED Medications  acetaminophen (TYLENOL) tablet 650 mg (has no administration in time range)  alum & mag hydroxide-simeth (MAALOX/MYLANTA) 200-200-20 MG/5ML suspension 30 mL (has  no administration in time range)  magnesium hydroxide (MILK OF MAGNESIA) suspension 30 mL (has no administration in time range)  hydrOXYzine (ATARAX/VISTARIL) tablet 25 mg (25 mg Oral Given 11/09/18 1627)  feeding supplement (ENSURE ENLIVE) (ENSURE ENLIVE) liquid 237 mL (237 mLs Oral Not Given 11/11/18 1611)  nicotine (NICODERM CQ - dosed in mg/24 hours) patch 21 mg (21 mg Transdermal Patch Removed 11/12/18 0834)  escitalopram (LEXAPRO) tablet 10 mg (10 mg Oral Given 11/12/18 8295)     Initial Impression / Assessment and Plan /  ED Course  I have reviewed the triage vital signs and the nursing notes.  Pertinent labs & imaging results that were available during my care of the patient were reviewed by me and considered in my medical decision making (see chart for details).     Patient seen and examined. Work-up initiated. Medications ordered. Dr. Venora Maples has spoken with [facility] earlier today.   Vital signs reviewed and are as follows: BP 128/86 (BP Location: Right Arm)   Pulse (!) 101   Temp 100.2 F (37.9 C) (Oral)   Resp 18   Ht 5\' 10"  (1.778 m)   Wt 95.7 kg   SpO2 99%   BMI 30.27 kg/m   2:11 PM influenza be positive.  CK is normal.  Remainder of lab work is reassuring.  Patient has received Tylenol.  Plan is for transfer back to behavioral health at this time.  Final Clinical Impressions(s) / ED Diagnoses   Final diagnoses:  Influenza B   Patient with fever, recent initiation of Seroquel.  Doubt neuroleptic malignant syndrome or serotonin syndrome at this point.  Patient has tested positive for influenza which is likely the cause of his fevers and joint pains.  Normal creatinine kinase.  ED Discharge Orders    None       Carlisle Cater, Hershal Coria 11/12/18 1411    Jola Schmidt, MD 11/12/18 [phone]

## 2018-11-12 NOTE — [facility] Suicide Risk Assessment (Signed)
[facility] Discharge Suicide Risk Assessment   Principal Problem: Depression, Psychosis Discharge Diagnoses: Psychosis, Unspecified. 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 rate 18, height 5\' 10"  (1.778 m), weight 95.7 kg, SpO2 97 %.Body mass index is 30.27 kg/m.  General Appearance: Well Groomed  Eye Contact::  Good  Speech:  Normal Rate409  Volume:  Normal  Mood:  States his mood is better, minimizes depression at this time  Affect:  Appropriate and Reactive  Thought Process:  Linear and Descriptions of Associations: Intact  Orientation:  Full (Time, Place, and Person)-he is fully alert, attentive and oriented x3  Thought Content:  No hallucinations, no delusions at this time, does not appear internally preoccupied  Suicidal Thoughts:  No denies any suicidal or self-injurious ideations, denies homicidal or violent ideations  Homicidal Thoughts:  No  Memory:  Recent and remote grossly intact  Judgement:  Other:  Improving  Insight:  Fair/improving  Psychomotor Activity:  Normal-no psychomotor agitation, no distal tremors, no rigidity  Concentration:  Good  Recall:  Good  Fund of Knowledge:Good  Language: Good  Akathisia:  Negative  Handed:  Right  AIMS (if indicated):     Assets:  Communication Skills Desire for Improvement Resilience Social Support  Sleep:  Number of Hours: 6  Cognition: WNL  ADL's:  Intact   Mental Status Per Nursing Assessment::   On Admission:  Suicidal ideation indicated by patient, Self-harm thoughts  Demographic Factors:  23 year old  male, single, one infant child who is currently with the mother, patient is employed.  Normally lives alone but states he is going to go stay with his mother for period of time for added support.  Loss Factors: No specific stressor identified.   Historical Factors: Prior psychiatric admissions, endorses history of depression.  Recent visual hallucinations.  Describes past psychotic symptoms.  Denies drug abuse.  Risk Reduction Factors:   Responsible for children under 30 years of age, Sense of responsibility to family, Employed, Living with another person, especially a relative, Positive social support and Positive coping skills or problem solving skills  Continued Clinical Symptoms:  Patient returns from ED, he was sent this a.m. due to new onset fever and malaise.  Work-up was positive for influenza.  Was medically cleared in ED. At this time patient reports he is feeling better than this morning.  Describes some lingering muscular aches and myalgias.  Denies coughing or shortness of breath and does not appear to be in any acute distress, breathing comfortably on room air.  He presents alert, attentive, well-groomed, mood described as improved, affect appropriate, more reactive.  No thought disorder.  Denies current hallucinations and does not currently present internally preoccupied.  No delusions are expressed.  No suicidal or self-injurious ideations, no homicidal ideations.  Future oriented. Patient was recently started on Seroquel, which she has been tolerating well.  At this time new onset fever appears to be related to influenza and not a medication side effect.  He is not presenting with any rigidity or cogwheeling, remains alert, oriented x3, without any changes in mental status.  CK was 98 (within normal). Behavior on unit in  good control.  Pleasant on approach. With his expressed consent I spoke with his mother who spent time with him while he was in the ED today.  She corroborates that  he seems much improved and that he seems back to baseline.  She agrees with him discharging today.  As above, he will be staying with her for period time for added support. We discussed medication options.  We have reviewed side effect profile to include risk of weight gain, movement disorders, metabolic side effects, and NMS being a rare but serious possible side effect.  As he was improving clinically on Seroquel, but expresses interest in continuing medication. Of note, states he was given a prescription for Tamiflu in ED.  Cognitive Features That Contribute To Risk:  No gross cognitive deficits noted upon discharge. Is alert , attentive, and oriented x 3   Suicide Risk:  Mild:  Suicidal ideation of limited frequency, intensity, duration, and specificity.  There are no identifiable plans, no associated intent, mild dysphoria and related symptoms, good self-control (both objective and subjective assessment), few other risk factors, and identifiable protective factors, including available and accessible social support.  Follow-up Information    [facility], [facility]. Go on 11/13/2018.   Why:  Your hospital follow up appointment is Tuesday, 1/14 at 11:00a.  Please bring: photo ID, proof of insurance, SSN, and discharge paperwork from this hospitalization.  Contact information: [place] [address] [phone]           Plan Of Care/Follow-up recommendations:  Activity:  As tolerated Diet:  Regular Tests:  NA Other:  See below  Patient is expressing readiness for discharge, there are no current grounds for involuntary commitment, is leaving unit in good spirits, mother will pick him up later today, plans to go live with mother for the time being.  Follow-up as above.  Patient to follow-up with his PCP for medical issues as needed-have encouraged rest and p.o. fluids as he improves from influenza, and importance of going to ED if any clinical worsening or dyspnea. Patient  also has an outpatient neurologist whom he plans to follow-up with as well.  Jenne Campus, MD 11/12/2018, 3:51 PM

## 2018-11-12 NOTE — ED Notes (Signed)
EDPA JOSH WILL WRITE RX AND PT CAN DECIDE TO TAKE OR NOT TO TAKE.

## 2018-11-12 NOTE — Discharge Summary (Addendum)
Physician Discharge Summary Note  Patient:  Gregg Crawford is an 23 y.o., male MRN:  [id number] DOB:  [birth year] Patient phone:  [phone] (home)  Patient address:   [address] [place] [address],  Total Time spent with patient: 15 minutes  Date of Admission:  11/09/2018 Date of Discharge: 11/12/2018  Reason for Admission:  Depression with suicidal ideation and visual hallucinations  Principal Problem: Severe episode of recurrent major depressive disorder, with psychotic features [facility]) Discharge Diagnoses: Principal Problem:   Severe episode of recurrent major depressive disorder, with psychotic features ([place]) Active Problems:   Schizophrenia, paranoid type ([place])   Past Psychiatric History: Per admission H&P: No prior psychiatric admissions, has never attempted suicide , denies any history of self cutting, reports history of depression, mainly over the last year. Denies history of mania, hypomania. Describes history of psychotic symptoms , intermittent, starting at about age 45.  Reports that these symptoms have occurred even when not depressed .  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 Reports history of fighting as a teenager, but states no recent violence or aggression.  Past Medical History:  Past Medical History:  Diagnosis Date  . Head injury 05/2010   reacurrent sxs dizziness, headaches,confusion had a negative MRI and EEG  . Psychiatric complaint   . Viral encephalitis     Past Surgical History:  Procedure Laterality Date  . FRACTURE SURGERY     fracture bilateral ankles   Family History:  Family History  Problem Relation Age of Onset  . Hypertension Father   . Diabetes Maternal Grandfather   . Early death Maternal Grandfather   . Hypertension Maternal Grandfather   . Heart disease Maternal Grandfather   . Melanoma Mother    Family Psychiatric  History: Per admission H&P: Denies history of mental illness or  suicides in family, father has history of alcohol use disorder Social History:  Social History   Substance and Sexual Activity  Alcohol Use Yes  . Alcohol/week: 10.0 standard drinks  . Types: 10 Cans of beer per week   Comment: was drinking 1-2 beers daily, last drink was 10/13/18     Social History   Substance and Sexual Activity  Drug Use Yes  . Frequency: 1.0 times per week  . Types: Marijuana   Comment: no use of marijuana in a month    Social History   Socioeconomic History  . Marital status: Single    Spouse name: Not on file  . Number of children: Not on file  . Years of education: Not on file  . Highest education level: Not on file  Occupational History  . Occupation: Autobody work  Scientific laboratory technician  . Financial resource strain: Not on file  . Food insecurity:    Worry: Not on file    Inability: Not on file  . Transportation needs:    Medical: Not on file    Non-medical: Not on file  Tobacco Use  . Smoking status: Never Smoker  . Smokeless tobacco: Current User    Types: Chew  . Tobacco comment: dips  Substance and Sexual Activity  . Alcohol use: Yes    Alcohol/week: 10.0 standard drinks    Types: 10 Cans of beer per week    Comment: was drinking 1-2 beers daily, last drink was 10/13/18  . Drug use: Yes    Frequency: 1.0 times per week    Types: Marijuana    Comment: no use of  marijuana in a month  . Sexual activity: Yes    Birth control/protection: Condom  Lifestyle  . Physical activity:    Days per week: Not on file    Minutes per session: Not on file  . Stress: Not on file  Relationships  . Social connections:    Talks on phone: Not on file    Gets together: Not on file    Attends religious service: Not on file    Active member of club or organization: Not on file    Attends meetings of clubs or organizations: Not on file    Relationship status: Not on file  Other Topics Concern  . Not on file  Social History Narrative   Lives at home with  father; stays with mother and sister on occasion; Takes supplement (C-4) prior to workouts      Dr. Gaynell Face for neuro   Dr. Christophe Louis PCP at [facility] on [facility]:  Per admission H&P 11/09/2018:  23 year old male . Presented to [facility] voluntarily, in the company of his mother. Patient states that several days ago he was out hunting in his property and he started seeing bobcats. States he initially saw one , then two, then several , then many. He contacted his family who came to his home and " could not see them". States that later that evening he started " seeing demonic faces". States that he continued to experience hallucinations for 2 days, which have since abated , although still sees " shadows" out of the corner of his eyes. Since then he has been afraid of returning to his home. He reports he had experienced similar experiences in the past, starting at around age 59 , and had in fact sold his prior home because he had seen a man standing inside it, hearing voices at times, and feeling uneasy, " like someone was watching". States he thought the house was haunted. He also reports he has been feeling that his family " no longer like me, like they are working together to mess my life up" , but has reality testing regarding this and states " I know that is not true". He states he has been feeling depressed over recent months, and states " I feel like I have been stuck in a state of depression". Endorses neuro vegetative symptoms as below. He has been experiencing suicidal ideations, without plan or intention, which he describes as ruminations regarding " what is the use of being alive". Patient denies drug abuse, reports he drinks about 10 beers on weekends, not on weekdays.  Gregg Crawford was admitted for depression with suicidal ideations and visual hallucinations of bobcats, demonic faces, and shadows. UDS negative and BAL <10. He was started on Lexapro and Seroquel. [facility] on [facility]  unit for 4 days. He stabilized with medication and therapy. He was discharged on the medications listed below. He has shown improvement with improved mood, affect, sleep, appetite, and interaction. He denies any SI/HI/AVH and contracts for safety. He agrees to follow up at [facility]. On day of discharge patient had fever (100.2) and reported general myalgia, cough, headache but no muscle rigidity and no mental status changes; patient was evaluated in the ED and tested positive for the flu. Patient is provided with prescriptions for their medications upon discharge (see below), including Tamiflu.  Physical Findings: AIMS: Facial and Oral Movements Muscles of Facial Expression: None, normal Lips and Perioral Area: None, normal Jaw: None, normal Tongue:  None, normal,Extremity Movements Upper (arms, wrists, hands, fingers): None, normal Lower (legs, knees, ankles, toes): None, normal, Trunk Movements Neck, shoulders, hips: None, normal, Overall Severity Severity of abnormal movements (highest score from questions above): None, normal Incapacitation due to abnormal movements: None, normal Patient's awareness of abnormal movements (rate only patient's report): No Awareness, Dental Status Current problems with teeth and/or dentures?: No Does patient usually wear dentures?: No  CIWA:  CIWA-Ar Total: 2 COWS:  COWS Total Score: 3  Musculoskeletal: Strength & Muscle Tone: within normal limits Gait & Station: normal Patient leans: N/A  Psychiatric Specialty Exam: Physical Exam  Nursing note and vitals reviewed. Constitutional: He is oriented to person, place, and time. He appears well-developed and well-nourished.  Cardiovascular: Normal rate.  Respiratory: Effort normal.  Neurological: He is alert and oriented to person, place, and time.    Review of Systems  Constitutional: Positive for fever and malaise/fatigue.  Respiratory: Positive for cough. Negative for shortness of breath.    Musculoskeletal: Positive for myalgias.  Neurological: Positive for headaches.  Psychiatric/Behavioral: Positive for depression (stable on medication). Negative for hallucinations, memory loss, substance abuse and suicidal ideas. The patient is not nervous/anxious and does not have insomnia.     Blood pressure 116/75, pulse 92, temperature 99.5 F (37.5 C), temperature source Oral, resp. rate 18, height 5\' 10"  (1.778 m), weight 95.7 kg, SpO2 97 %.Body mass index is 30.27 kg/m.  See MD's discharge SRA     Have you used any form of tobacco in the last 30 days? (Cigarettes, Smokeless Tobacco, Cigars, and/or Pipes): No  Has this patient used any form of tobacco in the last 30 days? (Cigarettes, Smokeless Tobacco, Cigars, and/or Pipes) Yes, an FDA-approved medication for tobacco cessation was offered at discharge.   Blood Alcohol level:  Lab Results  Component Value Date   ETH <10 43/15/4008    Metabolic Disorder Labs:  Lab Results  Component Value Date   HGBA1C 5.1 11/10/2018   MPG 99.67 11/10/2018   Lab Results  Component Value Date   PROLACTIN 25.6 (H) 11/10/2018   Lab Results  Component Value Date   CHOL 189 11/10/2018   TRIG 272 (H) 11/10/2018   HDL 42 11/10/2018   CHOLHDL 4.5 11/10/2018   VLDL 54 (H) 11/10/2018   LDLCALC 93 11/10/2018   LDLCALC 85 12/09/2015    See Psychiatric Specialty Exam and Suicide Risk Assessment completed by Attending Physician prior to discharge.  Discharge destination:  Home  Is patient on multiple antipsychotic therapies at discharge:  No   Has Patient had three or more failed trials of antipsychotic monotherapy by history:  No  Recommended Plan for Multiple Antipsychotic Therapies: NA  Discharge Instructions    Discharge instructions   Complete by:  As directed    Patient is instructed to take all prescribed medications as recommended. Report any side effects or adverse reactions to your outpatient psychiatrist. Patient is  instructed to abstain from alcohol and illegal drugs while on prescription medications. In the event of worsening symptoms, patient is instructed to call the crisis hotline, 911, or go to the nearest emergency department for evaluation and treatment.     Allergies as of 11/12/2018   No Known Allergies     Medication List    STOP taking these medications   valACYclovir 1000 MG tablet Commonly known as:  VALTREX     TAKE these medications     Indication  escitalopram 10 MG tablet Commonly known as:  LEXAPRO Take  1 tablet (10 mg total) by mouth daily. For mood  Indication:  Mood   hydrOXYzine 25 MG tablet Commonly known as:  ATARAX/VISTARIL Take 1 tablet (25 mg total) by mouth every 6 (six) hours as needed for anxiety.  Indication:  Feeling Anxious   nicotine 21 mg/24hr patch Commonly known as:  NICODERM CQ - dosed in mg/24 hours Place 1 patch (21 mg total) onto the skin daily. (May buy over the counter)    oseltamivir 75 MG capsule Commonly known as:  TAMIFLU Take 1 capsule (75 mg total) by mouth every 12 (twelve) hours.    QUEtiapine 50 MG tablet Commonly known as:  SEROQUEL Take 1 tablet (50 mg total) by mouth at bedtime.  Indication:  Hallucinations      Follow-up Information    [facility]. Go on 11/13/2018.   Why:  Your hospital follow up appointment is Tuesday, 1/14 at 11:00a.  Please bring: photo ID, proof of insurance, SSN, and discharge paperwork from this hospitalization.  Contact information: [address] [place] [address] [phone]           Follow-up recommendations: Follow-up for flu as recommended by ED physician. Activity as tolerated. Diet as recommended by primary care physician. Keep all scheduled follow-up appointments as recommended.   Comments:   Patient is instructed to take all prescribed medications as recommended. Report any side effects or adverse reactions to your outpatient psychiatrist. Patient is instructed to  abstain from alcohol and illegal drugs while on prescription medications. In the event of worsening symptoms, patient is instructed to call the crisis hotline, 911, or go to the nearest emergency department for evaluation and treatment.  Signed: Connye Burkitt, NP 11/13/2018, 8:18 AM   Patient seen, Suicide Assessment Completed.  Disposition Plan Reviewed

## 2018-11-12 NOTE — ED Triage Notes (Signed)
Currently at [facility] for psychiatric treatment. Pt has fever and elevated HR. Here for evaluation. EDP Campos aware of pt's current status. [employer] RN aware.

## 2018-11-12 NOTE — Progress Notes (Signed)
Patient denied SI and HI, contracts for safety.  Denied A/V hallucinations. Patient went to ED this morning because of fever and not feeling well. Patient was diagnosed with flu and will be discharging home this afternoon.

## 2018-11-12 NOTE — ED Notes (Signed)
PT STATES FLU SWAB HAS BEEN COLLECTED AT [facility]. AWAITING RESULTS

## 2018-11-12 NOTE — ED Notes (Signed)
[place] PRESENT AND WITH SITTER PT IS TRANSFERRED BACK TO [facility]. FAMILY PRESENT AND AWARE

## 2018-11-12 NOTE — ED Notes (Signed)
RETURNED FROM X-RAY

## 2018-11-12 NOTE — Progress Notes (Signed)
Discharge Note:  Patient discharged home with family member.  Patient denied SI and HI.  Denied A/V hallucinations.  Suicide prevention information given and discussed with patient who stated he understood and had no questions.  My3 app information also given to patient.  Patient stated he received all his belongings, clothing, toiletries, misc items, magazines, etc.  Patient stated he appreciated all assistance received from [facility] staff.  All required discharge information given to patient at discharge.  Voice message left on SW phone for a work letter to be mailed to patient's home.

## 2018-11-12 NOTE — Progress Notes (Signed)
D: Pt was in hallway upon initial approach.  Pt presents with appropriate affect and mood.  Pt reports his day has been "good."  His goal is to "get out of here" and pt reports he feels safe to discharge.  Pt denies SI/HI, denies hallucinations, denies pain.  Pt has been visible in milieu interacting with peers and staff appropriately.  Pt attended evening group.    A: Met with pt 1:1.  Actively listened to pt and offered support and encouragement. Medication administered per order.  Q15 minute safety checks maintained.  R: Pt is safe on the unit.  Pt is compliant with medication.  Pt verbally contracts for safety.  Will continue to monitor and assess.

## 2018-11-15 DIAGNOSIS — F329 Major depressive disorder, single episode, unspecified: Secondary | ICD-10-CM | POA: Diagnosis not present

## 2018-11-20 DIAGNOSIS — F329 Major depressive disorder, single episode, unspecified: Secondary | ICD-10-CM | POA: Diagnosis not present

## 2018-11-21 NOTE — Progress Notes (Signed)
Psychiatric Initial Adult Assessment   Patient Identification: Gregg Crawford MRN:  [id number] Date of Evaluation:  11/22/2018 Referral Source: Dr. Neita Garnet Chief Complaint:   Chief Complaint    Depression; Psychiatric Evaluation     Visit Diagnosis:    ICD-10-CM   1. MDD (major depressive disorder), recurrent episode, moderate (HCC) F33.1 Comprehensive Metabolic Panel (CMET)    History of Present Illness:   Gregg Crawford is a 23 y.o. year old male with a history of depression with psychotic features, history of testosterone abuse by history, who is referred for after care.   Per chart review, the patient was admitted to [facility] in January for worsening in depression and VH of seeing bobcats.   He states that he has been doing better since discharge from [facility].  Although he is unsure why he had worsening in depression prior to the hospitalization, he states that he has been feeling very stressed at work due to change in management.  Although he still feels stressed at work, he believes he has been managing things well.  He notices that he had been "socially awkward" prior to admission, and he is working on it.  He believes that the relationship with the mother of his son is going well.  He also reports good relationship with his son, who is 57 months old. He hopes to have more motivation and feel better so that he can make more money (he is interested in doing work for property). He agrees with what his mother says during the interview, stating that his mother is able to describe things well about him.   He has good sleep.  He has decreased appetite.  He has fair concentration.  He tends to have low motivation and energy, although it has been improving.  He denies SI.  He feels anxious and tense at times.  He goes to the gym 5 times per week.   Substance use- he used to use cocaine twice in the year; no use for 8 months. He used to drink one to two beers every day; he had not drink  since for a month. He denies other drug use.   Gambling- he has been doing gambling on and off. Although it has escalated last year, he has not done since November. He denies any urge.    The majority of the history is provided by his mother, who is Gregg Crawford has been very hardworking person, and has been successful in his job. He has 2 old son, who he provides support as well as the mother of his boy. He has had worsening in depression over the past several months before being admitted to [facility]  His mother states that although he asked help to his mother, his mother did not see that aspect at that time. She was more concerned that he did not have good responsibility in his child care (he now has good bonding with his son), had escalated cocaine use and gambling. He started to see bobcats. He could not come to his mother's house on January 1st, stating that he saw multiple bobcats around his house. He also reports paranoia that he saw something in the yard.  He became isolated even with his siblings, who he used to be very close with. His mother believes that he has been doing significantly better after discharge from [facility].  He is more personable.  Although she is aware that he has multiple of guns at  home , she denies any safety concern.  They are not satisfied with care at [facility], and is not planning to return there.  Wt Readings from Last 3 Encounters:  11/22/18 204 lb (92.5 kg)  11/12/18 210 lb 15.7 oz (95.7 kg)  09/07/18 213 lb (96.6 kg)     Associated Signs/Symptoms: Depression Symptoms:  depressed mood, anhedonia, fatigue, difficulty concentrating, anxiety, decreased appetite, (Hypo) Manic Symptoms:  denies decreased need for sleep, euphoria Anxiety Symptoms:  mild anxiety Psychotic Symptoms:  dneies AH, VH, paranoia PTSD Symptoms: Negative  Past Psychiatric History:  Outpatient: seen by [facility] last week. No history of depression before this  episode Psychiatry admission: [facility] in 10/2018 for worsening in depression Previous suicide attempt: denies   Past trials of medication: lexapro, quetiapine History of violence:   Previous Psychotropic Medications: Yes   Substance Abuse History in the last 12 months:  Yes.    Consequences of Substance Abuse: worsening in depression  Past Medical History:  Past Medical History:  Diagnosis Date  . Head injury 05/2010   reacurrent sxs dizziness, headaches,confusion had a negative MRI and EEG  . Psychiatric complaint   . Viral encephalitis     Past Surgical History:  Procedure Laterality Date  . FRACTURE SURGERY     fracture bilateral ankles    Family Psychiatric History:  Denies   Family History:  Family History  Problem Relation Age of Onset  . Hypertension Father   . Diabetes Maternal Grandfather   . Early death Maternal Grandfather   . Hypertension Maternal Grandfather   . Heart disease Maternal Grandfather   . Melanoma Mother     Social History:   Social History   Socioeconomic History  . Marital status: Single    Spouse name: Not on file  . Number of children: Not on file  . Years of education: Not on file  . Highest education level: Not on file  Occupational History  . Occupation: Autobody work  Scientific laboratory technician  . Financial resource strain: Not on file  . Food insecurity:    Worry: Not on file    Inability: Not on file  . Transportation needs:    Medical: Not on file    Non-medical: Not on file  Tobacco Use  . Smoking status: Never Smoker  . Smokeless tobacco: Current User    Types: Chew  . Tobacco comment: dips  Substance and Sexual Activity  . Alcohol use: Yes    Alcohol/week: 10.0 standard drinks    Types: 10 Cans of beer per week    Comment: was drinking 1-2 beers daily, last drink was 10/13/18  . Drug use: Yes    Frequency: 1.0 times per week    Types: Marijuana    Comment: no use of marijuana in a month  . Sexual activity: Yes    Birth  control/protection: Condom  Lifestyle  . Physical activity:    Days per week: Not on file    Minutes per session: Not on file  . Stress: Not on file  Relationships  . Social connections:    Talks on phone: Not on file    Gets together: Not on file    Attends religious service: Not on file    Active member of club or organization: Not on file    Attends meetings of clubs or organizations: Not on file    Relationship status: Not on file  Other Topics Concern  . Not on file  Social History Narrative  Lives at home with father; stays with mother and sister on occasion; Takes supplement (C-4) prior to workouts      Dr. Gaynell Face for neuro   Dr. Christophe Louis PCP at [facility] on [facility]    Additional Social History:  He lives by himself. Never married. Has 78 old son.  He grew up in [place].  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   Allergies:  No Known Allergies  Metabolic Disorder Labs: Lab Results  Component Value Date   HGBA1C 5.1 11/10/2018   MPG 99.67 11/10/2018   Lab Results  Component Value Date   PROLACTIN 25.6 (H) 11/10/2018   Lab Results  Component Value Date   CHOL 189 11/10/2018   TRIG 272 (H) 11/10/2018   HDL 42 11/10/2018   CHOLHDL 4.5 11/10/2018   VLDL 54 (H) 11/10/2018   [place] 93 11/10/2018   [place] 85 12/09/2015   Lab Results  Component Value Date   TSH 1.013 11/10/2018    Therapeutic Level Labs: No results found for: LITHIUM No results found for: CBMZ No results found for: VALPROATE  Current Medications: Current Outpatient Medications  Medication Sig Dispense Refill  . escitalopram (LEXAPRO) 10 MG tablet Take 1 tablet (10 mg total) by mouth daily. For mood 30 tablet 0  . hydrOXYzine (ATARAX/VISTARIL) 25 MG tablet Take 1 tablet (25 mg total) by mouth daily as needed for anxiety. 30 tablet 0  . nicotine (NICODERM CQ - DOSED IN MG/24 HOURS) 21 mg/24hr patch Place 1 patch (21 mg total) onto the skin daily. (May  buy over the counter) 28 patch 0  . oseltamivir (TAMIFLU) 75 MG capsule Take 1 capsule (75 mg total) by mouth every 12 (twelve) hours. 10 capsule 0  . QUEtiapine (SEROQUEL) 50 MG tablet Take 1 tablet (50 mg total) by mouth at bedtime. 30 tablet 0   No current facility-administered medications for this visit.     Musculoskeletal: Strength & Muscle Tone: within normal limits Gait & Station: normal Patient leans: N/A  Psychiatric Specialty Exam: Review of Systems  Psychiatric/Behavioral: Positive for depression. Negative for hallucinations, memory loss, substance abuse and suicidal ideas. The patient is nervous/anxious. The patient does not have insomnia.   All other systems reviewed and are negative.   Blood pressure 115/74, pulse 76, height 5\' 10"  (1.778 m), weight 204 lb (92.5 kg), SpO2 97 %.Body mass index is 29.27 kg/m.  General Appearance: Fairly Groomed  Eye Contact:  Good  Speech:  Clear and Coherent, slightly increased latency  Volume:  Normal  Mood:  "better"  Affect:  Appropriate, Congruent, Restricted and down  Thought Process:  Coherent  Orientation:  Full (Time, Place, and Person)  Thought Content:  Logical  Suicidal Thoughts:  No  Homicidal Thoughts:  No  Memory:  Immediate;   Good  Judgement:  Good  Insight:  Fair  Psychomotor Activity:  Normal  Concentration:  Concentration: Good and Attention Span: Good  Recall:  Good  Fund of Knowledge:Good  Language: Good  Akathisia:  No  Handed:  Right  AIMS (if indicated):  No tremors  Assets:  Communication Skills Desire for Improvement  ADL's:  Intact  Cognition: WNL  Sleep:  Fair   Screenings: AIMS     ED to Hosp-Admission (Discharged) from 11/09/2018 in [place] [facility]  AIMS Total Score  0    AUDIT     ED to Hosp-Admission (Discharged) from 11/09/2018 in [place] [facility]  Alcohol Use Disorder Identification Test Final Score (AUDIT)  0    Mini-Mental      Office Visit from 11/04/2015 in [facility]  Total Score (max 30 points )  29    PHQ2-9     Office Visit from 09/07/2018 in [place] [facility] Office Visit from 05/09/2018 in [place] [facility] Office Visit from 04/12/2017 in [place] Office Visit from 12/09/2015 in [place] at [facility]  PHQ-2 Total Score  0  0  0  0  PHQ-9 Total Score  0  0  0  -      Assessment and Plan:  DEOVION BATREZ is a 23 y.o. year old male with a history of depression with psychotic features, history of testosterone abuse by history, who is referred for after care.   # MDD, moderate, single episode with psychotic features Although exam is notable for restricted affect and slight psychomotor retardation, the patient and his mother reports significant improvement in depressive symptoms after discharge from [facility] 2 weeks ago.  Psychosocial stressors include work.  Noted that there is a possibility of him minimizing his symptoms while he is very cooperative during the interview. Will continue current dose of medication because it was recently started.  Will continue Lexapro to target depression.  Will continue quetiapine as adjunctive treatment for depression.  Discussed potential metabolic side effect and drowsiness.  Will continue hydroxyzine as needed for anxiety.  Discussed behavioral activation.   # cocaine use, in partial remission # Gambling disorder Patient reported history of escalating cocaine use and gambling when he had worsening in depression.  He denies any issues with this since discharge from [facility].  Will continue to monitor.   Plan 1. Continue lexapro 10 mg daily  2. Continue quetiapine 50 mg at night   3. Continue hydroxyzine 25 mg daily as needed for anxiety 3. Return to clinic in one month for 30 mins - will repeat CMP, monitor cre, LFT given  abnormality during admission.   The patient demonstrates the following risk factors for suicide: Chronic risk factors for suicide include: psychiatric disorder of depression. Acute risk factors for suicide include: N/A. Protective factors for this patient include: positive social support, responsibility to others (children, family), coping skills and hope for the future. Considering these factors, the overall suicide risk at this point appears to be low. Patient is appropriate for outpatient follow up.  Although he does have gun access at home, both the patient and the mother presents to the interview feels comfortable not to remove guns.   Norman Clay, MD 1/23/202011:14 AM

## 2018-11-22 ENCOUNTER — Encounter (HOSPITAL_COMMUNITY): Payer: Self-pay | Admitting: Psychiatry

## 2018-11-22 ENCOUNTER — Ambulatory Visit (HOSPITAL_COMMUNITY): Payer: 59 | Admitting: Psychiatry

## 2018-11-22 VITALS — BP 115/74 | HR 76 | Ht 70.0 in | Wt 204.0 lb

## 2018-11-22 DIAGNOSIS — F331 Major depressive disorder, recurrent, moderate: Secondary | ICD-10-CM

## 2018-11-22 LAB — COMPREHENSIVE METABOLIC PANEL
AG Ratio: 1.6 (calc) (ref 1.0–2.5)
ALT: 49 U/L — ABNORMAL HIGH (ref 9–46)
AST: 22 U/L (ref 10–40)
Albumin: 4.4 g/dL (ref 3.6–5.1)
Alkaline phosphatase (APISO): 82 U/L (ref 40–115)
BUN: 8 mg/dL (ref 7–25)
CO2: 28 mmol/L (ref 20–32)
CREATININE: 1.01 mg/dL (ref 0.60–1.35)
Calcium: 9.7 mg/dL (ref 8.6–10.3)
Chloride: 106 mmol/L (ref 98–110)
Globulin: 2.8 g/dL (calc) (ref 1.9–3.7)
Glucose, Bld: 56 mg/dL — ABNORMAL LOW (ref 65–139)
Potassium: 4.7 mmol/L (ref 3.5–5.3)
Sodium: 141 mmol/L (ref 135–146)
TOTAL PROTEIN: 7.2 g/dL (ref 6.1–8.1)
Total Bilirubin: 0.6 mg/dL (ref 0.2–1.2)

## 2018-11-22 MED ORDER — ESCITALOPRAM OXALATE 10 MG PO TABS: 10.0000 mg | ORAL_TABLET | Freq: Every day | ORAL | 0 refills | Status: DC

## 2018-11-22 MED ORDER — HYDROXYZINE HCL 25 MG PO TABS: 25.0000 mg | ORAL_TABLET | Freq: Every day | ORAL | 0 refills | Status: DC | PRN

## 2018-11-22 MED ORDER — QUETIAPINE FUMARATE 50 MG PO TABS: 50.0000 mg | ORAL_TABLET | Freq: Every day | ORAL | 0 refills | Status: DC

## 2018-11-22 NOTE — Patient Instructions (Addendum)
1. Continue lexapro 10 mg daily  2. Continue quetiapine 50 mg at night   3. Return to clinic in one month for 30 mins 4. Obtain blood test (CMP)

## 2018-11-23 ENCOUNTER — Encounter (HOSPITAL_COMMUNITY): Payer: Self-pay | Admitting: Psychiatry

## 2018-12-11 MED FILL — QUETIAPINE FUMARATE 50 MG T: 50 | 30 days supply | Qty: 30 | Fill #0

## 2018-12-11 MED FILL — hydrOXYzine HCL 25 MG TABS: 25 | 30 days supply | Qty: 30 | Fill #0

## 2018-12-11 MED FILL — ESCITALOPRAM 10 MG TABLET: 10 | 30 days supply | Qty: 30 | Fill #0

## 2018-12-18 NOTE — Progress Notes (Signed)
[place] MD/PA/NP OP Progress Note  12/25/2018 8:35 AM Gregg Crawford  MRN:  [id number]  Chief Complaint:  Chief Complaint    Follow-up; Depression     HPI:  Patient presents for follow-up appointment for depression.  He states that he has been feeling better.  He goes to work Monday through Saturday, and functions well.  The patient and his coworker notices that he has better interaction with other people.  He has been still overwhelmed; he talks about the mother of his son, who is "manipulative." He is concerned that he may need to more child support as the son's mother is unemployed. He is also concerned about his company, being taken over by the other company.  He occasionally has passive SI when he feels overwhelmed, although he denies any intent or plans.  He has hypersomnia.  He has good appetite.  He has good concentration.  He feels anxious and tense at times.  He denies panic attacks.  He denies AH, VH or paranoia.  He denies cocaine use.  He drinks a few beers, a few days per week; he denies any craving for alcohol.     Visit Diagnosis:    ICD-10-CM   1. MDD (major depressive disorder), recurrent episode, moderate ([place]) F33.1     Past Psychiatric History: Please see initial evaluation for full details. I have reviewed the history. No updates at this time.     Past Medical History:  Past Medical History:  Diagnosis Date  . Head injury 05/2010   reacurrent sxs dizziness, headaches,confusion had a negative MRI and EEG  . Psychiatric complaint   . Viral encephalitis     Past Surgical History:  Procedure Laterality Date  . FRACTURE SURGERY     fracture bilateral ankles    Family Psychiatric History: Please see initial evaluation for full details. I have reviewed the history. No updates at this time.     Family History:  Family History  Problem Relation Age of Onset  . Hypertension Father   . Diabetes Maternal Grandfather   . Early death Maternal Grandfather   .  Hypertension Maternal Grandfather   . Heart disease Maternal Grandfather   . Melanoma Mother     Social History:  Social History   Socioeconomic History  . Marital status: Single    Spouse name: Not on file  . Number of children: Not on file  . Years of education: Not on file  . Highest education level: Not on file  Occupational History  . Occupation: Autobody work  Scientific laboratory technician  . Financial resource strain: Not on file  . Food insecurity:    Worry: Not on file    Inability: Not on file  . Transportation needs:    Medical: Not on file    Non-medical: Not on file  Tobacco Use  . Smoking status: Never Smoker  . Smokeless tobacco: Current User    Types: Chew  . Tobacco comment: dips  Substance and Sexual Activity  . Alcohol use: Yes    Alcohol/week: 10.0 standard drinks    Types: 10 Cans of beer per week    Comment: was drinking 1-2 beers daily, last drink was 10/13/18  . Drug use: Yes    Frequency: 1.0 times per week    Types: Marijuana    Comment: no use of marijuana in a month  . Sexual activity: Yes    Birth control/protection: Condom  Lifestyle  . Physical activity:    Days per week:  Not on file    Minutes per session: Not on file  . Stress: Not on file  Relationships  . Social connections:    Talks on phone: Not on file    Gets together: Not on file    Attends religious service: Not on file    Active member of club or organization: Not on file    Attends meetings of clubs or organizations: Not on file    Relationship status: Not on file  Other Topics Concern  . Not on file  Social History Narrative   Lives at home with father; stays with mother and sister on occasion; Takes supplement (C-4) prior to workouts      Dr. Gaynell Face for neuro   Dr. Christophe Louis PCP at [facility] on [facility]    Allergies: No Known Allergies  Metabolic Disorder Labs: Lab Results  Component Value Date   HGBA1C 5.1 11/10/2018   MPG 99.67 11/10/2018   Lab Results  Component Value  Date   PROLACTIN 25.6 (H) 11/10/2018   Lab Results  Component Value Date   CHOL 189 11/10/2018   TRIG 272 (H) 11/10/2018   HDL 42 11/10/2018   CHOLHDL 4.5 11/10/2018   VLDL 54 (H) 11/10/2018   LDLCALC 93 11/10/2018   LDLCALC 85 12/09/2015   Lab Results  Component Value Date   TSH 1.013 11/10/2018   TSH 0.95 12/09/2015    Therapeutic Level Labs: No results found for: LITHIUM No results found for: VALPROATE No components found for:  CBMZ  Current Medications: Current Outpatient Medications  Medication Sig Dispense Refill  . escitalopram (LEXAPRO) 20 MG tablet Take 1 tablet (20 mg total) by mouth daily. 30 tablet 0  . hydrOXYzine (ATARAX/VISTARIL) 25 MG tablet Take 1 tablet (25 mg total) by mouth daily as needed for anxiety. 30 tablet 0  . nicotine (NICODERM CQ - DOSED IN MG/24 HOURS) 21 mg/24hr patch Place 1 patch (21 mg total) onto the skin daily. (May buy over the counter) 28 patch 0  . QUEtiapine (SEROQUEL) 50 MG tablet Take 1 tablet (50 mg total) by mouth at bedtime. 30 tablet 0   No current facility-administered medications for this visit.      Musculoskeletal: Strength & Muscle Tone: within normal limits Gait & Station: normal Patient leans: N/A  Psychiatric Specialty Exam: Review of Systems  Psychiatric/Behavioral: Positive for depression and suicidal ideas. Negative for hallucinations, memory loss and substance abuse. The patient is nervous/anxious. The patient does not have insomnia.   All other systems reviewed and are negative.   Blood pressure 125/83, pulse 98, height 5\' 10"  (1.778 m), weight 211 lb (95.7 kg), SpO2 99 %.Body mass index is 30.28 kg/m.  General Appearance: Fairly Groomed  Eye Contact:  Good  Speech:  Clear and Coherent  Volume:  Normal  Mood:  "better"  Affect:  Appropriate, Congruent and less restricted, smiles  Thought Process:  Coherent  Orientation:  Full (Time, Place, and Person)  Thought Content: Logical   Suicidal Thoughts:   Yes.  without intent/plan  Homicidal Thoughts:  No  Memory:  Immediate;   Good  Judgement:  Good  Insight:  Fair  Psychomotor Activity:  Normal  Concentration:  Concentration: Good and Attention Span: Good  Recall:  Good  Fund of Knowledge: Good  Language: Good  Akathisia:  No  Handed:  Right  AIMS (if indicated): not done  Assets:  Communication Skills Desire for Improvement  ADL's:  Intact  Cognition: WNL  Sleep:  Good  Screenings: AIMS     ED to Hosp-Admission (Discharged) from 11/09/2018 in [place] [facility]  AIMS Total Score  0    AUDIT     ED to Hosp-Admission (Discharged) from 11/09/2018 in [place] [facility]  Alcohol Use Disorder Identification Test Final Score (AUDIT)  0    Mini-Mental     Office Visit from 11/04/2015 in [facility] [place]  Total Score (max 30 points )  29    PHQ2-9     Office Visit from 09/07/2018 in [place] [facility] Office Visit from 05/09/2018 in [place] Office Visit from 04/12/2017 in [place] Office Visit from 12/09/2015 in [place] at [facility]  PHQ-2 Total Score  0  0  0  0  PHQ-9 Total Score  0  0  0  -       Assessment and Plan:  Gregg Crawford is a 23 y.o. year old male with a history of depression with psychotic features, history of testosterone abuse by history , who presents for follow up appointment for MDD (major depressive disorder), recurrent episode, moderate ([place])  # MDD, moderate, single episode There has been overall improvement in restricted affect and psychomotor retardation since the last visit.  Psychosocial stressors including work, and conflict with the mother of his son.  Will uptitrate Lexapro to target residual mood symptoms of depression.  Will continue quetiapine as adjunctive treatment for depression.   Noted that patient reports fatigue; although it is more attributable to depressive symptoms, will continue to monitor any side effect from quetiapine.  Discussed risk of metabolic side effect and drowsiness.  Will continue hydroxyzine as needed for anxiety.  Discussed behavioral activation.  Although he will greatly benefit from CBT, he is not interested in this option.  Will continue to monitor.   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  Will continue to monitor.   Plan 1. Increase Lexapro 20 mg daily  2. Continue quetiapine 50 mg at night  3. Continue hydroxyzine 25 mg daily as needed for anxiety 4. Return to clinic in one month for 15 mins 5. Follow up your liver function test (AST 22, ALT 49 on 11/22/2018) at your primary care visit Reviewed labs; cre wnl,   Past trials of medication: lexapro, quetiapine  The patient demonstrates the following risk factors for suicide: Chronic risk factors for suicide include: psychiatric disorder of depression. Acute risk factors for suicide include: N/A. Protective factors for this patient include: positive social support, responsibility to others (children, family), coping skills and hope for the future. Considering these factors, the overall suicide risk at this point appears to be low. Patient is appropriate for outpatient follow up.  Although he does have gun access at home, both the patient and the mother presents to the interview feels comfortable not to remove guns.  The duration of this appointment visit was 25 minutes of face-to-face time with the patient.  Greater than 50% of this time was spent in counseling, explanation of  diagnosis, planning of further management, and coordination of care.  Norman Clay, MD 12/25/2018, 8:35 AM

## 2018-12-25 ENCOUNTER — Ambulatory Visit (HOSPITAL_COMMUNITY): Payer: 59 | Admitting: Psychiatry

## 2018-12-25 ENCOUNTER — Encounter (HOSPITAL_COMMUNITY): Payer: Self-pay | Admitting: Psychiatry

## 2018-12-25 VITALS — BP 125/83 | HR 98 | Ht 70.0 in | Wt 211.0 lb

## 2018-12-25 DIAGNOSIS — F331 Major depressive disorder, recurrent, moderate: Secondary | ICD-10-CM | POA: Diagnosis not present

## 2018-12-25 MED ORDER — ESCITALOPRAM OXALATE 20 MG PO TABS: 20.0000 mg | ORAL_TABLET | Freq: Every day | ORAL | 0 refills | Status: DC

## 2018-12-25 MED ORDER — HYDROXYZINE HCL 25 MG PO TABS: 25.0000 mg | ORAL_TABLET | Freq: Every day | ORAL | 0 refills | Status: DC | PRN

## 2018-12-25 MED ORDER — QUETIAPINE FUMARATE 50 MG PO TABS: 50.0000 mg | ORAL_TABLET | Freq: Every day | ORAL | 0 refills | Status: DC

## 2018-12-25 NOTE — Patient Instructions (Signed)
1. Increase lexapro 20 mg daily  2. Continue quetiapine 50 mg at night  3. Continue hydroxyzine 25 mg daily as needed for anxiety 4. Return to clinic in one month for 15 mins 5. Follow up your liver function test (AST 22, ALT 49 on 11/22/2018) at your primary care visit

## 2019-01-02 MED FILL — ESCITALOPRAM 20 MG TABLET: 20 | 30 days supply | Qty: 30 | Fill #0

## 2019-01-14 MED FILL — hydrOXYzine HCL 25 MG TABS: 25 | 30 days supply | Qty: 30 | Fill #0

## 2019-01-14 MED FILL — QUETIAPINE FUMARATE 50 MG T: 50 | 30 days supply | Qty: 30 | Fill #0

## 2019-01-17 NOTE — Progress Notes (Deleted)
[facility] MD/PA/NP OP Progress Note  01/17/2019 12:21 PM Gregg Crawford  MRN:  [id number]  Chief Complaint:  HPI: *** Visit Diagnosis: No diagnosis found.  Past Psychiatric History: Please see initial evaluation for full details. I have reviewed the history. No updates at this time.     Past Medical History:  Past Medical History:  Diagnosis Date  . Head injury 05/2010   reacurrent sxs dizziness, headaches,confusion had a negative MRI and EEG  . Psychiatric complaint   . Viral encephalitis     Past Surgical History:  Procedure Laterality Date  . FRACTURE SURGERY     fracture bilateral ankles    Family Psychiatric History: Please see initial evaluation for full details. I have reviewed the history. No updates at this time.     Family History:  Family History  Problem Relation Age of Onset  . Hypertension Father   . Diabetes Maternal Grandfather   . Early death Maternal Grandfather   . Hypertension Maternal Grandfather   . Heart disease Maternal Grandfather   . Melanoma Mother     Social History:  Social History   Socioeconomic History  . Marital status: Single    Spouse name: Not on file  . Number of children: Not on file  . Years of education: Not on file  . Highest education level: Not on file  Occupational History  . Occupation: Autobody work  Scientific laboratory technician  . Financial resource strain: Not on file  . Food insecurity:    Worry: Not on file    Inability: Not on file  . Transportation needs:    Medical: Not on file    Non-medical: Not on file  Tobacco Use  . Smoking status: Never Smoker  . Smokeless tobacco: Current User    Types: Chew  . Tobacco comment: dips  Substance and Sexual Activity  . Alcohol use: Yes    Alcohol/week: 10.0 standard drinks    Types: 10 Cans of beer per week    Comment: was drinking 1-2 beers daily, last drink was 10/13/18  . Drug use: Yes    Frequency: 1.0 times per week    Types: Marijuana    Comment: no use of marijuana in  a month  . Sexual activity: Yes    Birth control/protection: Condom  Lifestyle  . Physical activity:    Days per week: Not on file    Minutes per session: Not on file  . Stress: Not on file  Relationships  . Social connections:    Talks on phone: Not on file    Gets together: Not on file    Attends religious service: Not on file    Active member of club or organization: Not on file    Attends meetings of clubs or organizations: Not on file    Relationship status: Not on file  Other Topics Concern  . Not on file  Social History Narrative   Lives at home with father; stays with mother and sister on occasion; Takes supplement (C-4) prior to workouts      Dr. Gaynell Face for neuro   Dr. Christophe Louis PCP at [facility] on [facility]    Allergies: No Known Allergies  Metabolic Disorder Labs: Lab Results  Component Value Date   HGBA1C 5.1 11/10/2018   MPG 99.67 11/10/2018   Lab Results  Component Value Date   PROLACTIN 25.6 (H) 11/10/2018   Lab Results  Component Value Date   CHOL 189 11/10/2018   TRIG 272 (H) 11/10/2018  HDL 42 11/10/2018   CHOLHDL 4.5 11/10/2018   VLDL 54 (H) 11/10/2018   LDLCALC 93 11/10/2018   LDLCALC 85 12/09/2015   Lab Results  Component Value Date   TSH 1.013 11/10/2018   TSH 0.95 12/09/2015    Therapeutic Level Labs: No results found for: LITHIUM No results found for: VALPROATE No components found for:  CBMZ  Current Medications: Current Outpatient Medications  Medication Sig Dispense Refill  . escitalopram (LEXAPRO) 20 MG tablet Take 1 tablet (20 mg total) by mouth daily. 30 tablet 0  . hydrOXYzine (ATARAX/VISTARIL) 25 MG tablet Take 1 tablet (25 mg total) by mouth daily as needed for anxiety. 30 tablet 0  . nicotine (NICODERM CQ - DOSED IN MG/24 HOURS) 21 mg/24hr patch Place 1 patch (21 mg total) onto the skin daily. (May buy over the counter) 28 patch 0  . QUEtiapine (SEROQUEL) 50 MG tablet Take 1 tablet (50 mg total) by mouth at bedtime. 30  tablet 0   No current facility-administered medications for this visit.      Musculoskeletal: Strength & Muscle Tone: within normal limits Gait & Station: normal Patient leans: N/A  Psychiatric Specialty Exam: ROS  There were no vitals taken for this visit.There is no height or weight on file to calculate BMI.  General Appearance: Fairly Groomed  Eye Contact:  Good  Speech:  Clear and Coherent  Volume:  Normal  Mood:  {BHH MOOD:22306}  Affect:  {Affect (PAA):22687}  Thought Process:  Coherent  Orientation:  Full (Time, Place, and Person)  Thought Content: Logical   Suicidal Thoughts:  {ST/HT (PAA):22692}  Homicidal Thoughts:  {ST/HT (PAA):22692}  Memory:  Immediate;   Good  Judgement:  {Judgement (PAA):22694}  Insight:  {Insight (PAA):22695}  Psychomotor Activity:  Normal  Concentration:  Concentration: Good and Attention Span: Good  Recall:  Good  Fund of Knowledge: Good  Language: Good  Akathisia:  No  Handed:  Right  AIMS (if indicated): not done  Assets:  Communication Skills Desire for Improvement  ADL's:  Intact  Cognition: WNL  Sleep:  {BHH GOOD/FAIR/POOR:22877}   Screenings: AIMS     ED to Hosp-Admission (Discharged) from 11/09/2018 in [place] [facility]  AIMS Total Score  0    AUDIT     ED to Hosp-Admission (Discharged) from 11/09/2018 in [place] [facility]  Alcohol Use Disorder Identification Test Final Score (AUDIT)  0    Mini-Mental     Office Visit from 11/04/2015 in [facility]  Total Score (max 30 points )  29    PHQ2-9     Office Visit from 09/07/2018 in [facility] [place] Office Visit from 05/09/2018 in [place] Office Visit from 04/12/2017 in [place] Office Visit from 12/09/2015 in [place] at [facility]  PHQ-2 Total Score  0  0  0   0  PHQ-9 Total Score  0  0  0  -       Assessment and Plan:  Gregg Crawford is a 23 y.o. year old male with a history of depression with psychotic features,  history of testosterone abuse by history  , who presents for follow up appointment for No diagnosis found.  # MDD, moderate, single episode  There has been overall improvement in restricted affect and psychomotor retardation since the last visit.  Psychosocial stressors including work, and conflict with the mother of his  son.  Will uptitrate Lexapro to target residual mood symptoms of depression.  Will continue quetiapine as adjunctive treatment for depression.  Noted that patient reports fatigue; although it is more attributable to depressive symptoms, will continue to monitor any side effect from quetiapine.  Discussed risk of metabolic side effect and drowsiness.  Will continue hydroxyzine as needed for anxiety.  Discussed behavioral activation.  Although he will greatly benefit from CBT, he is not interested in this option.  Will continue to monitor.   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  Will continue to monitor.   Plan 1. Increase Lexapro 20 mg daily  2. Continue quetiapine 50 mg at night  3. Continue hydroxyzine 25 mg daily as needed for anxiety 4.Return to clinic in one month for 15 mins 5. Follow up your liver function test (AST 22, ALT 49 on 11/22/2018) at your primary care visit Reviewed labs; cre wnl,   Past trials of medication:lexapro, quetiapine  The patient demonstrates the following risk factors for suicide: Chronic risk factors for suicide include:psychiatric disorder ofdepression. Acute risk factorsfor suicide include: N/A. Protective factorsfor this patient include: positive social support, responsibility to others (children, family), coping skills and hope for the future. Considering these factors, the overall  suicide risk at this point appears to below. Patientisappropriate for outpatient follow up. Although he does have gun access at home,both the patient and the mother presents to the interview feelscomfortable not to remove guns.   Norman Clay, MD 01/17/2019, 12:21 PM

## 2019-01-23 ENCOUNTER — Ambulatory Visit (HOSPITAL_COMMUNITY): Payer: 59 | Admitting: Psychiatry

## 2019-01-23 NOTE — Progress Notes (Addendum)
Virtual Visit via Telephone Note  I connected with Gregg Crawford on 01/24/19 at  8:30 AM EDT by telephone and verified that I am speaking with the correct person using two identifiers.   I discussed the limitations, risks, security and privacy concerns of performing an evaluation and management service by telephone and the availability of in person appointments. I also discussed with the patient that there may be a patient responsible charge related to this service. The patient expressed understanding and agreed to proceed.    I discussed the assessment and treatment plan with the patient. The patient was provided an opportunity to ask questions and all were answered. The patient agreed with the plan and demonstrated an understanding of the instructions.   The patient was advised to call back or seek an in-person evaluation if the symptoms worsen or if the condition fails to improve as anticipated.  I provided 15 minutes of non-face-to-face time during this encounter.   Norman Clay, MD    [facility] MD/PA/NP OP Progress Note  01/24/2019 8:57 AM Gregg Crawford  MRN:  [id number]  Chief Complaint:  Chief Complaint    Follow-up; Depression     HPI:  This is a follow-up visit for depression.  He states that he has been doing very well.  He denies any side effect after up titration of Celexa.  He goes to work regularly.  Although he lately with his coworkers at work, I have limited interaction with each other.  Although he has not had a lot of opportunities to talk with other people, he believes that he denies any concerns about his communication as he used to have.  He has "alright" relationship with the mother of his son. His son is doing very well.  He sleeps better.  He denies feeling fatigue.  He feels less depressed.  Has fair concentration. He has fair appetite. He denies SI.  Denies anxiety or panic attack.  He drinks 4-5,12 oz beers, 4-5 times per day.  He denies any alcohol use in the  morning.  He is willing to limit his alcohol intake to less than 3 beers at a time. He denies cocaine use. He denies any gambling, and he feels good about it.     Visit Diagnosis: No diagnosis found.  Past Psychiatric History: Please see initial evaluation for full details. I have reviewed the history. No updates at this time.     Past Medical History:  Past Medical History:  Diagnosis Date  . Head injury 05/2010   reacurrent sxs dizziness, headaches,confusion had a negative MRI and EEG  . Psychiatric complaint   . Viral encephalitis     Past Surgical History:  Procedure Laterality Date  . FRACTURE SURGERY     fracture bilateral ankles    Family Psychiatric History: Please see initial evaluation for full details. I have reviewed the history. No updates at this time.     Family History:  Family History  Problem Relation Age of Onset  . Hypertension Father   . Diabetes Maternal Grandfather   . Early death Maternal Grandfather   . Hypertension Maternal Grandfather   . Heart disease Maternal Grandfather   . Melanoma Mother     Social History:  Social History   Socioeconomic History  . Marital status: Single    Spouse name: Not on file  . Number of children: Not on file  . Years of education: Not on file  . Highest education level: Not on file  Occupational History  . Occupation: Autobody work  Scientific laboratory technician  . Financial resource strain: Not on file  . Food insecurity:    Worry: Not on file    Inability: Not on file  . Transportation needs:    Medical: Not on file    Non-medical: Not on file  Tobacco Use  . Smoking status: Never Smoker  . Smokeless tobacco: Current User    Types: Chew  . Tobacco comment: dips  Substance and Sexual Activity  . Alcohol use: Yes    Alcohol/week: 10.0 standard drinks    Types: 10 Cans of beer per week    Comment: was drinking 1-2 beers daily, last drink was 10/13/18  . Drug use: Yes    Frequency: 1.0 times per week    Types:  Marijuana    Comment: no use of marijuana in a month  . Sexual activity: Yes    Birth control/protection: Condom  Lifestyle  . Physical activity:    Days per week: Not on file    Minutes per session: Not on file  . Stress: Not on file  Relationships  . Social connections:    Talks on phone: Not on file    Gets together: Not on file    Attends religious service: Not on file    Active member of club or organization: Not on file    Attends meetings of clubs or organizations: Not on file    Relationship status: Not on file  Other Topics Concern  . Not on file  Social History Narrative   Lives at home with father; stays with mother and sister on occasion; Takes supplement (C-4) prior to workouts      Dr. Gaynell Face for neuro   Dr. Christophe Louis PCP at [facility] on [facility]    Allergies: No Known Allergies  Metabolic Disorder Labs: Lab Results  Component Value Date   HGBA1C 5.1 11/10/2018   MPG 99.67 11/10/2018   Lab Results  Component Value Date   PROLACTIN 25.6 (H) 11/10/2018   Lab Results  Component Value Date   CHOL 189 11/10/2018   TRIG 272 (H) 11/10/2018   HDL 42 11/10/2018   CHOLHDL 4.5 11/10/2018   VLDL 54 (H) 11/10/2018   LDLCALC 93 11/10/2018   LDLCALC 85 12/09/2015   Lab Results  Component Value Date   TSH 1.013 11/10/2018   TSH 0.95 12/09/2015    Therapeutic Level Labs: No results found for: LITHIUM No results found for: VALPROATE No components found for:  CBMZ  Current Medications: Current Outpatient Medications  Medication Sig Dispense Refill  . escitalopram (LEXAPRO) 20 MG tablet Take 1 tablet (20 mg total) by mouth daily. 30 tablet 0  . hydrOXYzine (ATARAX/VISTARIL) 25 MG tablet Take 1 tablet (25 mg total) by mouth daily as needed for anxiety. 30 tablet 0  . nicotine (NICODERM CQ - DOSED IN MG/24 HOURS) 21 mg/24hr patch Place 1 patch (21 mg total) onto the skin daily. (May buy over the counter) 28 patch 0  . QUEtiapine (SEROQUEL) 50 MG tablet Take 1  tablet (50 mg total) by mouth at bedtime. 30 tablet 0   No current facility-administered medications for this visit.      Musculoskeletal: Strength & Muscle Tone: within normal limits Gait & Station: normal Patient leans: N/A  Psychiatric Specialty Exam: Review of Systems  Psychiatric/Behavioral: Positive for depression. Negative for hallucinations, memory loss, substance abuse and suicidal ideas. The patient is not nervous/anxious and does not have insomnia.   All  other systems reviewed and are negative.   There were no vitals taken for this visit.There is no height or weight on file to calculate BMI.  General Appearance: N/A  Eye Contact:  N/A  Speech:  Clear and Coherent  Volume:  Normal  Mood:  "good"  Affect:  NA  Thought Process:  Coherent  Orientation:  Full (Time, Place, and Person)  Thought Content: Logical   Suicidal Thoughts:  No  Homicidal Thoughts:  No  Memory:  Immediate;   Good  Judgement:  Good  Insight:  Fair  Psychomotor Activity:  Normal  Concentration:  Concentration: Good and Attention Span: Good  Recall:  Good  Fund of Knowledge: Good  Language: Good  Akathisia:  No  Handed:  Right  AIMS (if indicated): not done  Assets:  Communication Skills Desire for Improvement  ADL's:  Intact  Cognition: WNL  Sleep:  Good   Screenings: AIMS     ED to Hosp-Admission (Discharged) from 11/09/2018 in [place] [facility]  AIMS Total Score  0    AUDIT     ED to Hosp-Admission (Discharged) from 11/09/2018 in [place] [facility]  Alcohol Use Disorder Identification Test Final Score (AUDIT)  0    Mini-Mental     Office Visit from 11/04/2015 in [facility] [place]  Total Score (max 30 points )  29    PHQ2-9     Office Visit from 09/07/2018 in [facility] [place] Office Visit from 05/09/2018 in [place] Office Visit from 04/12/2017  in [place] Office Visit from 12/09/2015 in [place] at [facility]  PHQ-2 Total Score  0  0  0  0  PHQ-9 Total Score  0  0  0  -       Assessment and Plan:  Gregg Crawford is a 23 y.o. year old male with a history of depression with psychotic features, testosterone abuse by history who presents for follow up appointment for No diagnosis found.  # MDD, mild, single episode There has been overall improvement in depressive symptoms after up titration of Lexapro.  Psychosocial stressors includes work, and conflict with the mother of his son.  Will continue Lexapro to target depression.  Will continue quetiapine as adjunctive treatment for depression.  Discussed potential metabolic side effect and drowsiness.  Will continue hydroxyzine as needed for anxiety.  Discussed behavioral activation.   # Cocain use in partial remission # Gambling disorder He has not done gambling or any cocaine use since the last visit.  There is a slight increase in alcohol intake, although he denies any impairment from drinking.  He is willing to limit his alcohol use.  Will continue to monitor.   Plan I have reviewed and updated plans as below 1. Continue Lexapro 20 mg daily  2. Continue quetiapine 50 mg at night  3. Continue hydroxyzine 25 mg daily as needed for anxiety 4.Return to clinic in three months for 15 mins 5. Follow up your liver function test (AST 22, ALT 49 on 11/22/2018) at your primary care visit Reviewed labs; cre wnl,   Past trials of medication:Lexapro, quetiapine  The patient demonstrates the following risk factors for suicide: Chronic risk factors for suicide include:psychiatric disorder ofdepression. Acute risk factorsfor suicide include: N/A. Protective factorsfor this patient include: positive social support, responsibility to others (children, family), coping skills and hope for the future. Considering these  factors, the overall suicide risk at this point appears to below. Patientisappropriate for outpatient follow up. Although he does have gun access at home,both the patient and the mother presents to the interview feelscomfortable not to remove guns.  Norman Clay, MD 01/24/2019, 8:57 AM

## 2019-01-24 ENCOUNTER — Encounter (HOSPITAL_COMMUNITY): Payer: Self-pay | Admitting: Psychiatry

## 2019-01-24 ENCOUNTER — Other Ambulatory Visit: Payer: Self-pay

## 2019-01-24 ENCOUNTER — Ambulatory Visit (INDEPENDENT_AMBULATORY_CARE_PROVIDER_SITE_OTHER): Payer: 59 | Admitting: Psychiatry

## 2019-01-24 DIAGNOSIS — Z79899 Other long term (current) drug therapy: Secondary | ICD-10-CM

## 2019-01-24 DIAGNOSIS — F1411 Cocaine abuse, in remission: Secondary | ICD-10-CM | POA: Diagnosis not present

## 2019-01-24 DIAGNOSIS — F630 Pathological gambling: Secondary | ICD-10-CM | POA: Diagnosis not present

## 2019-01-24 DIAGNOSIS — F330 Major depressive disorder, recurrent, mild: Secondary | ICD-10-CM | POA: Diagnosis not present

## 2019-01-24 MED ORDER — QUETIAPINE FUMARATE 50 MG PO TABS: 50.0000 mg | ORAL_TABLET | Freq: Every day | ORAL | 0 refills | Status: DC

## 2019-01-24 MED ORDER — ESCITALOPRAM OXALATE 20 MG PO TABS: 20.0000 mg | ORAL_TABLET | Freq: Every day | ORAL | 0 refills | Status: DC

## 2019-01-24 NOTE — Patient Instructions (Signed)
1. Continue Lexapro 20 mg daily  2. Continue quetiapine 50 mg at night  3.Continue hydroxyzine 25 mg daily as needed for anxiety 4.Return to clinic in three month for71mins

## 2019-01-24 NOTE — Addendum Note (Signed)
Addended by: Norman Clay on: 01/24/2019 09:13 AM   Modules accepted: Orders

## 2019-02-01 MED FILL — ESCITALOPRAM 20 MG TABLET: 20 | 90 days supply | Qty: 90 | Fill #0

## 2019-02-06 MED FILL — QUETIAPINE FUMARATE 50 MG T: 50 | 90 days supply | Qty: 90 | Fill #0

## 2019-05-13 ENCOUNTER — Ambulatory Visit
Admission: EM | Admit: 2019-05-13 | Discharge: 2019-05-13 | Disposition: A | Discharge status: Home / Self Care | Payer: 59 | Attending: Emergency Medicine | Admitting: Emergency Medicine

## 2019-05-13 ENCOUNTER — Other Ambulatory Visit: Payer: Self-pay

## 2019-05-13 ENCOUNTER — Ambulatory Visit (INDEPENDENT_AMBULATORY_CARE_PROVIDER_SITE_OTHER): Payer: 59

## 2019-05-13 DIAGNOSIS — S60922A Unspecified superficial injury of left hand, initial encounter: Secondary | ICD-10-CM

## 2019-05-13 DIAGNOSIS — S6992XA Unspecified injury of left wrist, hand and finger(s), initial encounter: Secondary | ICD-10-CM

## 2019-05-13 DIAGNOSIS — M79642 Pain in left hand: Secondary | ICD-10-CM | POA: Diagnosis not present

## 2019-05-13 DIAGNOSIS — M7989 Other specified soft tissue disorders: Secondary | ICD-10-CM | POA: Diagnosis not present

## 2019-05-13 MED ORDER — NAPROXEN 500 MG PO TABS: 500.0000 mg | ORAL_TABLET | Freq: Two times a day (BID) | ORAL | 0 refills | Status: DC

## 2019-05-13 MED FILL — NAPROXEN 500 MG TABLET: 500 | 15 days supply | Qty: 30 | Fill #0

## 2019-05-13 NOTE — ED Triage Notes (Signed)
Pt presents with complaints of left hand injury, states he was painting a truck last night. As we went to take the bed off of the truck it fell landing on his left hand. Hand is swollen with decreased range of motion. CMS intact.

## 2019-05-13 NOTE — Discharge Instructions (Signed)
X-rays did not show fracture or dislocation However, I am concerned for possible scaphoid fracture  Thumb spica placed.  Wear 24/7 until cleared by orthopedist Continue conservative management of rest, ice, and elevation Take naproxen as needed for pain relief (may cause abdominal discomfort, ulcers, and GI bleeds avoid taking with other NSAIDs) Follow up with orthopedist for further evaluation and management, possible repeat x-rays Return or go to the ER if you have any new or worsening symptoms (fever, chills, increased swelling, changes in sensation or color, worsening pain despite treatments, etc...)

## 2019-05-13 NOTE — ED Provider Notes (Signed)
[place]   [id number] 05/13/19 Arrival Time: 29  CC: left hand injury  SUBJECTIVE: History from: patient. Gregg Crawford is a 23 y.o. male complains of left hand pain that began 1 day ago.  Symptoms began after the bed of his truck fell directly on his left thumb. Localizes the pain to the left thumb.  Describes the pain as constant and throbbing in character.  Has NOT tried OTC medications.  Symptoms are made worse with ROM of thumb.  Complains of swelling, and weakness.  Denies fever, chills, sensation changes.    ROS: As per HPI.  Past Medical History:  Diagnosis Date  . Head injury 05/2010   reacurrent sxs dizziness, headaches,confusion had a negative MRI and EEG  . Psychiatric complaint   . Viral encephalitis    Past Surgical History:  Procedure Laterality Date  . FRACTURE SURGERY     fracture bilateral ankles   No Known Allergies No current facility-administered medications on file prior to encounter.    Current Outpatient Medications on File Prior to Encounter  Medication Sig Dispense Refill  . [DISCONTINUED] escitalopram (LEXAPRO) 20 MG tablet Take 1 tablet (20 mg total) by mouth daily. 90 tablet 0  . [DISCONTINUED] QUEtiapine (SEROQUEL) 50 MG tablet Take 1 tablet (50 mg total) by mouth at bedtime. 90 tablet 0   Social History   Socioeconomic History  . Marital status: Single    Spouse name: Not on file  . Number of children: Not on file  . Years of education: Not on file  . Highest education level: Not on file  Occupational History  . Occupation: Autobody work  Scientific laboratory technician  . Financial resource strain: Not on file  . Food insecurity    Worry: Not on file    Inability: Not on file  . Transportation needs    Medical: Not on file    Non-medical: Not on file  Tobacco Use  . Smoking status: Never Smoker  . Smokeless tobacco: Current User    Types: Chew  . Tobacco comment: dips  Substance and Sexual Activity  . Alcohol use: Yes   Alcohol/week: 10.0 standard drinks    Types: 10 Cans of beer per week    Comment: was drinking 1-2 beers daily, last drink was 10/13/18  . Drug use: Yes    Frequency: 1.0 times per week    Types: Marijuana    Comment: no use of marijuana in a month  . Sexual activity: Yes    Birth control/protection: Condom  Lifestyle  . Physical activity    Days per week: Not on file    Minutes per session: Not on file  . Stress: Not on file  Relationships  . Social Herbalist on phone: Not on file    Gets together: Not on file    Attends religious service: Not on file    Active member of club or organization: Not on file    Attends meetings of clubs or organizations: Not on file    Relationship status: Not on file  . Intimate partner violence    Fear of current or ex partner: Not on file    Emotionally abused: Not on file    Physically abused: Not on file    Forced sexual activity: Not on file  Other Topics Concern  . Not on file  Social History Narrative   Lives at home with father; stays with mother and sister on occasion; Takes supplement (C-4) prior  to workouts      Dr. Gaynell Face for neuro   Dr. Christophe Louis PCP at [facility] on [place] History  Problem Relation Age of Onset  . Hypertension Father   . Diabetes Maternal Grandfather   . Early death Maternal Grandfather   . Hypertension Maternal Grandfather   . Heart disease Maternal Grandfather   . Melanoma Mother     OBJECTIVE:  Vitals:   05/13/19 1638  BP: 129/80  Pulse: 80  Resp: 16  Temp: 98.1 F (36.7 C)  TempSrc: Oral  SpO2: 98%    General appearance: ALERT; in no acute distress.  Head: NCAT Lungs: Normal respiratory effort CV: Radial pulse 2+. Cap refill < 2 seconds Musculoskeletal: Left hand Inspection: Left thumb with obvious swelling Palpation: TTP over first phalanx, snuff box and lateral carpal bones ROM: LROM about the wrist and hand Strength:  Decreased grip strength Skin: warm and dry  Neurologic: Ambulates without difficulty; Sensation intact about the upper extremities Psychological: alert and cooperative; normal mood and affect  DIAGNOSTIC STUDIES:  Dg Hand Complete Left  Result Date: 05/13/2019 CLINICAL DATA:  LEFT hand injury last night, continued swelling and pain, most pain along thumb and first digit. EXAM: LEFT HAND - COMPLETE 3+ VIEW COMPARISON:  None. FINDINGS: There is no evidence of fracture or dislocation. There is no evidence of arthropathy or other focal bone abnormality. Soft tissues are unremarkable. IMPRESSION: Negative. Electronically Signed   By: Franki Cabot M.D.   On: 05/13/2019 16:40     ASSESSMENT & PLAN:  1. Injury of left thumb, initial encounter     Meds ordered this encounter  Medications  . naproxen (NAPROSYN) 500 MG tablet    Sig: Take 1 tablet (500 mg total) by mouth 2 (two) times daily.    Dispense:  30 tablet    Refill:  0    Order Specific Question:   Supervising Provider    Answer:   Raylene Everts [[id number]]   X-rays did not show fracture or dislocation However, I am concerned for possible scaphoid fracture  Thumb spica placed.  Wear 24/7 until cleared by orthopedist Continue conservative management of rest, ice, and elevation Take naproxen as needed for pain relief (may cause abdominal discomfort, ulcers, and GI bleeds avoid taking with other NSAIDs) Follow up with orthopedist for further evaluation and management, possible repeat x-rays Return or go to the ER if you have any new or worsening symptoms (fever, chills, increased swelling, changes in sensation or color, worsening pain despite treatments, etc...)   Reviewed expectations re: course of current medical issues. Questions answered. Outlined signs and symptoms indicating need for more acute intervention. Patient verbalized understanding. After Visit Summary given.    Lestine Box, PA-C 05/13/19 1758

## 2019-10-04 ENCOUNTER — Telehealth: Payer: Self-pay | Admitting: Family Medicine

## 2019-10-04 NOTE — Telephone Encounter (Signed)
Ok to transfer. 

## 2019-10-04 NOTE — Telephone Encounter (Signed)
Patient would like to transfer their care to Dr. Charlett Blake, please advise

## 2019-10-14 NOTE — Telephone Encounter (Signed)
Patients mother requesting callback today in regards to [facility]  To Dr. Charlett Blake

## 2019-10-14 NOTE — Telephone Encounter (Signed)
Dr. Charlett Blake not currently taking new patients

## 2019-10-14 NOTE — Telephone Encounter (Signed)
Is Dr Charlett Blake accepting new pts?

## 2019-10-15 NOTE — Telephone Encounter (Signed)
Notified pt mother, Lynelle Smoke, that Dr. Charlett Blake is not able to accept pts at this time. She is wanting pt to stay with Dr. Birdie Riddle.

## 2019-10-26 ENCOUNTER — Other Ambulatory Visit: Payer: Self-pay | Admitting: Family Medicine

## 2019-10-29 NOTE — Telephone Encounter (Signed)
Ok for appt  

## 2019-10-29 NOTE — Telephone Encounter (Signed)
Since pt did not establish with someone else, he is technically still my pt but it is quite awkward that they wanted to leave w/o any reason given and now want to stay- by default.

## 2019-10-29 NOTE — Telephone Encounter (Signed)
Called to schedule appointment with Dr. Birdie Riddle. Mother stated that she was in a meeting and would need to call back to schedule.

## 2020-02-19 ENCOUNTER — Encounter: Payer: Self-pay | Admitting: Family Medicine

## 2020-02-19 ENCOUNTER — Other Ambulatory Visit: Payer: Self-pay

## 2020-02-19 ENCOUNTER — Ambulatory Visit: Payer: 59 | Admitting: Family Medicine

## 2020-02-19 VITALS — BP 124/86 | HR 115 | Temp 97.8°F | Resp 16 | Ht 70.0 in | Wt 192.4 lb

## 2020-02-19 DIAGNOSIS — E663 Overweight: Secondary | ICD-10-CM

## 2020-02-19 DIAGNOSIS — N6320 Unspecified lump in the left breast, unspecified quadrant: Secondary | ICD-10-CM

## 2020-02-19 DIAGNOSIS — Z0000 Encounter for general adult medical examination without abnormal findings: Secondary | ICD-10-CM

## 2020-02-19 NOTE — Progress Notes (Signed)
   Subjective:    Patient ID: Gregg Crawford, male    DOB: 02-21-1996, 24 y.o.   MRN: [id number]  HPI CPE- UTD on Tdap.  L breast mass- first appeared 1-2 yrs ago.  Is enlarging, pt reports this is more consistently painful.  No longer using steroids.   Review of Systems  Patient reports no vision/hearing changes, anorexia, fever ,adenopathy, persistant/recurrent hoarseness, swallowing issues, chest pain, palpitations, edema, persistant/recurrent cough, hemoptysis, dyspnea (rest,exertional, paroxysmal nocturnal), gastrointestinal  bleeding (melena, rectal bleeding), abdominal pain, excessive heart burn, GU symptoms (dysuria, hematuria, voiding/incontinence issues) syncope, focal weakness, memory loss, numbness & tingling, skin/hair/nail changes, depression, anxiety, abnormal bruising/bleeding, musculoskeletal symptoms/signs.   This visit occurred during the SARS-CoV-2 public health emergency.  Safety protocols were in place, including screening questions prior to the visit, additional usage of staff PPE, and extensive cleaning of exam room while observing appropriate contact time as indicated for disinfecting solutions.       Objective:   Physical Exam General Appearance:    Alert, cooperative, no distress, appears stated age  Head:    Normocephalic, without obvious abnormality, atraumatic  Eyes:    PERRL, conjunctiva/corneas clear, EOM's intact, fundi    benign, both eyes       Ears:    Normal TM's and external ear canals, both ears  Nose:   Deferred due to COVID  Throat:   Neck:   Supple, symmetrical, trachea midline, no adenopathy;       thyroid:  No enlargement/tenderness/nodules  Back:     Symmetric, no curvature, ROM normal, no CVA tenderness  Lungs:     Clear to auscultation bilaterally, respirations unlabored  Chest wall:    5 cm x 1 cm oblong L breast mass starting at lateral areola and extending diagonally towards axilla  Heart:    Regular rate and rhythm, S1 and S2 normal,  no murmur, rub   or gallop  Abdomen:     Soft, non-tender, bowel sounds active all four quadrants,    no masses, no organomegaly  Genitalia:    deferred  Rectal:    Extremities:   Extremities normal, atraumatic, no cyanosis or edema  Pulses:   2+ and symmetric all extremities  Skin:   Skin color, texture, turgor normal, no rashes or lesions  Lymph nodes:   Cervical, supraclavicular, and axillary nodes normal  Neurologic:   CNII-XII intact. Normal strength, sensation and reflexes      throughout          Assessment & Plan:  L breast mass- new.  Pt reports breast mass present x1-2 yrs but this has been enlarging and is now consistently painful.  Given his hx of steroid abuse this needs to be assessed for breast development vs cyst vs abscess vs malignancy.  Will follow closely.

## 2020-02-19 NOTE — Patient Instructions (Signed)
Follow up in 1 year or as needed We'll notify you of your lab results and make any changes if needed We'll call you with your ultrasound appt Call with any questions or concerns Stay Safe!  Stay Healthy!

## 2020-02-19 NOTE — Assessment & Plan Note (Signed)
Pt's PE WNL w/ exception of L breast mass.  UTD on Tdap.  Check labs.  Anticipatory guidance provided.

## 2020-02-20 ENCOUNTER — Encounter: Payer: Self-pay | Admitting: General Practice

## 2020-02-20 LAB — CBC WITH DIFFERENTIAL/PLATELET
Basophils Absolute: 0.1 10*3/uL (ref 0.0–0.1)
Basophils Relative: 0.8 % (ref 0.0–3.0)
Eosinophils Absolute: 0 10*3/uL (ref 0.0–0.7)
Eosinophils Relative: 0.4 % (ref 0.0–5.0)
HCT: 46.3 % (ref 39.0–52.0)
Hemoglobin: 15.8 g/dL (ref 13.0–17.0)
Lymphocytes Relative: 31.8 % (ref 12.0–46.0)
Lymphs Abs: 2.6 10*3/uL (ref 0.7–4.0)
MCHC: 34.1 g/dL (ref 30.0–36.0)
MCV: 89.7 fl (ref 78.0–100.0)
Monocytes Absolute: 0.7 10*3/uL (ref 0.1–1.0)
Monocytes Relative: 8.3 % (ref 3.0–12.0)
Neutro Abs: 4.9 10*3/uL (ref 1.4–7.7)
Neutrophils Relative %: 58.7 % (ref 43.0–77.0)
Platelets: 259 10*3/uL (ref 150.0–400.0)
RBC: 5.16 Mil/uL (ref 4.22–5.81)
RDW: 12.7 % (ref 11.5–15.5)
WBC: 8.3 10*3/uL (ref 4.0–10.5)

## 2020-02-20 LAB — BASIC METABOLIC PANEL
BUN: 10 mg/dL (ref 6–23)
CO2: 29 mEq/L (ref 19–32)
Calcium: 9.9 mg/dL (ref 8.4–10.5)
Chloride: 101 mEq/L (ref 96–112)
Creatinine, Ser: 0.94 mg/dL (ref 0.40–1.50)
GFR: 97.64 mL/min (ref >60.00)
Glucose, Bld: 82 mg/dL (ref 70–99)
Potassium: 3.7 mEq/L (ref 3.5–5.1)
Sodium: 140 mEq/L (ref 135–145)

## 2020-02-20 LAB — TSH: TSH: 3.21 u[IU]/mL (ref 0.35–4.50)

## 2020-02-20 LAB — LIPID PANEL
Cholesterol: 180 mg/dL (ref 0–200)
HDL: 53.6 mg/dL (ref >39.00)
LDL Cholesterol: 94 mg/dL (ref 0–99)
NonHDL: 126.61
Total CHOL/HDL Ratio: 3
Triglycerides: 162 mg/dL — ABNORMAL HIGH (ref 0.0–149.0)
VLDL: 32.4 mg/dL (ref 0.0–40.0)

## 2020-02-20 LAB — HEPATIC FUNCTION PANEL
ALT: 25 U/L (ref 0–53)
AST: 24 U/L (ref 0–37)
Albumin: 4.8 g/dL (ref 3.5–5.2)
Alkaline Phosphatase: 81 U/L (ref 39–117)
Bilirubin, Direct: 0.1 mg/dL (ref 0.0–0.3)
Total Bilirubin: 0.7 mg/dL (ref 0.2–1.2)
Total Protein: 7.3 g/dL (ref 6.0–8.3)

## 2020-05-12 ENCOUNTER — Other Ambulatory Visit: Payer: Self-pay | Admitting: Family Medicine

## 2020-05-12 NOTE — Telephone Encounter (Signed)
LFD 10/28/19 #30 with no refills LOV 02/19/20 NOV none

## 2020-10-22 ENCOUNTER — Other Ambulatory Visit: Payer: Self-pay | Admitting: Family Medicine

## 2020-10-22 NOTE — Telephone Encounter (Signed)
LFD 05/13/19 #30 with no refills LOV 02/19/20 NOV none

## 2021-02-08 ENCOUNTER — Telehealth: Payer: Self-pay

## 2021-02-08 NOTE — Telephone Encounter (Signed)
Pt called to discuss the Breast US that Dr. Birdie Riddle placed last year. Pt is now wanting to get that done - can Dr. Birdie Riddle put a new order in since it is older than 6 months? Please advise.

## 2021-02-08 NOTE — Telephone Encounter (Signed)
Does patient need an appt for this or can a new order be placed?

## 2021-02-09 ENCOUNTER — Other Ambulatory Visit: Payer: Self-pay

## 2021-02-09 DIAGNOSIS — N6320 Unspecified lump in the left breast, unspecified quadrant: Secondary | ICD-10-CM

## 2021-02-09 NOTE — Telephone Encounter (Signed)
We can re-do the order from last year

## 2021-02-09 NOTE — Telephone Encounter (Signed)
Placed order for breast US for patient.

## 2021-02-16 ENCOUNTER — Other Ambulatory Visit: Payer: Self-pay | Admitting: Family Medicine

## 2021-02-16 DIAGNOSIS — N6320 Unspecified lump in the left breast, unspecified quadrant: Secondary | ICD-10-CM

## 2021-03-24 ENCOUNTER — Other Ambulatory Visit: Payer: Self-pay | Admitting: Family Medicine

## 2021-03-24 ENCOUNTER — Ambulatory Visit: Payer: 59

## 2021-03-24 ENCOUNTER — Ambulatory Visit
Admission: RE | Admit: 2021-03-24 | Discharge: 2021-03-24 | Disposition: A | Discharge status: Home / Self Care | Payer: 59 | Source: Ambulatory Visit | Attending: Family Medicine | Admitting: Family Medicine

## 2021-03-24 ENCOUNTER — Other Ambulatory Visit: Payer: Self-pay

## 2021-03-24 DIAGNOSIS — N6320 Unspecified lump in the left breast, unspecified quadrant: Secondary | ICD-10-CM

## 2021-03-24 NOTE — Telephone Encounter (Signed)
LFD 10/22/20 #30 with no refills LOV 02/19/20 NOV none

## 2021-08-25 ENCOUNTER — Other Ambulatory Visit: Payer: Self-pay | Admitting: Family Medicine

## 2021-10-04 ENCOUNTER — Other Ambulatory Visit: Payer: Self-pay

## 2021-10-04 ENCOUNTER — Emergency Department (HOSPITAL_COMMUNITY)
Admission: EM | Admit: 2021-10-04 | Discharge: 2021-10-04 | Disposition: A | Discharge status: Home / Self Care | Payer: No Typology Code available for payment source | Attending: Emergency Medicine | Admitting: Emergency Medicine

## 2021-10-04 ENCOUNTER — Emergency Department (HOSPITAL_COMMUNITY): Payer: No Typology Code available for payment source

## 2021-10-04 ENCOUNTER — Encounter (HOSPITAL_COMMUNITY): Payer: Self-pay

## 2021-10-04 DIAGNOSIS — J101 Influenza due to other identified influenza virus with other respiratory manifestations: Secondary | ICD-10-CM | POA: Insufficient documentation

## 2021-10-04 DIAGNOSIS — Z28310 Unvaccinated for covid-19: Secondary | ICD-10-CM | POA: Insufficient documentation

## 2021-10-04 DIAGNOSIS — Z20822 Contact with and (suspected) exposure to covid-19: Secondary | ICD-10-CM | POA: Insufficient documentation

## 2021-10-04 DIAGNOSIS — F17220 Nicotine dependence, chewing tobacco, uncomplicated: Secondary | ICD-10-CM | POA: Insufficient documentation

## 2021-10-04 LAB — RESP PANEL BY RT-PCR (FLU A&B, COVID) ARPGX2
Influenza A by PCR: POSITIVE — AB
Influenza B by PCR: NEGATIVE
SARS Coronavirus 2 by RT PCR: NEGATIVE

## 2021-10-04 LAB — GROUP A STREP BY PCR: Group A Strep by PCR: NOT DETECTED

## 2021-10-04 MED ORDER — ACETAMINOPHEN 325 MG PO TABS
650.0000 mg | ORAL_TABLET | Freq: Once | ORAL | Status: AC
  Administered 2021-10-04: 650 mg via ORAL
  Filled 2021-10-04: qty 2

## 2021-10-04 NOTE — Discharge Instructions (Addendum)
You have the flu, recommend over-the-counter pain medications like ibuprofen Tylenol for fever and pain control, nasal decongestions like Flonase and Zyrtec, Mucinex for cough.  If not eating recommend supplementing with Gatorade to help with electrolyte supplementation.  Follow-up PCP for further evaluation.  Come back to the emergency department if you develop chest pain, shortness of breath, severe abdominal pain, uncontrolled nausea, vomiting, diarrhea.

## 2021-10-04 NOTE — ED Provider Notes (Signed)
[facility] EMERGENCY DEPARTMENT Provider Note   CSN: [id number] Arrival date & time: 10/04/21  4709     History Chief Complaint  Patient presents with   Shortness of Breath    Gregg Crawford is a 25 y.o. male.  HPI  Patient with no significant medical history presents with chief complaint of URI-like symptoms.  Patient states this started about 3 days ago, he endorses fevers, chills, nasal congestion, sore throat, chest pain, shortness of breath, and general body aches.  He states that the chest pain started yesterday, remains in the middle of his chest, does not radiate, states he feels like he has a lot of stuff in his chest causing pain, he states he feels slightly short of breath when he coughs, he denies pleuritic chest pain, peripheral edema, leg pain, no cardiac history, no history of PEs or DVTs currently not on hormone therapy, does not smoke.  Patient states that he has had a decrease in appetite, but is still tolerating p.o., he is not vaccinated for the flu or COVID, is unsure of recent sick contacts.  He denies any alleviating factors.  Past Medical History:  Diagnosis Date   Head injury 05/2010   reacurrent sxs dizziness, headaches,confusion had a negative MRI and EEG   Psychiatric complaint    Viral encephalitis     Patient Active Problem List   Diagnosis Date Noted   Umbilical hernia 62/83/6629   Physical exam 12/09/2015   Smokeless tobacco use 12/01/2011    Past Surgical History:  Procedure Laterality Date   FRACTURE SURGERY     fracture bilateral ankles       Family History  Problem Relation Age of Onset   Hypertension Father    Diabetes Maternal Grandfather    Early death Maternal Grandfather    Hypertension Maternal Grandfather    Heart disease Maternal Grandfather    Melanoma Mother     Social History   Tobacco Use   Smoking status: Never   Smokeless tobacco: Current    Types: Chew   Tobacco comments:    dips  Vaping Use   Vaping  Use: Never used  Substance Use Topics   Alcohol use: Yes    Alcohol/week: 10.0 standard drinks    Types: 10 Cans of beer per week    Comment: was drinking 1-2 beers daily, last drink was 10/13/18   Drug use: Yes    Frequency: 1.0 times per week    Types: Marijuana    Comment: no use of marijuana in a month    Home Medications Prior to Admission medications   Medication Sig Start Date End Date Taking? Authorizing Provider  naproxen (NAPROSYN) 500 MG tablet Take 1 tablet (500 mg total) by mouth 2 (two) times daily. 05/13/19   Wurst, [place], PA-C  valACYclovir (VALTREX) 1000 MG tablet Take 1 tablet by mouth once daily 08/25/21   Midge Minium, MD  escitalopram (LEXAPRO) 20 MG tablet Take 1 tablet (20 mg total) by mouth daily. 01/24/19 05/13/19  Norman Clay, MD  QUEtiapine (SEROQUEL) 50 MG tablet Take 1 tablet (50 mg total) by mouth at bedtime. 01/24/19 05/13/19  Norman Clay, MD    Allergies    Patient has no known allergies.  Review of Systems   Review of Systems  Constitutional:  Positive for appetite change, chills and fever.  HENT:  Positive for congestion and sore throat.   Respiratory:  Positive for cough. Negative for shortness of breath.   Cardiovascular:  Positive for chest pain. Negative for palpitations and leg swelling.  Gastrointestinal:  Negative for abdominal pain, diarrhea, nausea and vomiting.  Genitourinary:  Negative for enuresis.  Musculoskeletal:  Positive for myalgias. Negative for back pain.  Skin:  Negative for rash.  Neurological:  Negative for headaches.  Hematological:  Does not bruise/bleed easily.   Physical Exam Updated Vital Signs BP 116/85   Pulse 96   Temp (!) 103.3 F (39.6 C) (Oral)   Resp 15   Ht 5\' 10"  (1.778 m)   Wt 93 kg   SpO2 95%   BMI 29.41 kg/m   Physical Exam Vitals and nursing note reviewed.  Constitutional:      General: He is not in acute distress.    Appearance: He is not ill-appearing.  HENT:     Head:  Normocephalic and atraumatic.     Right Ear: Tympanic membrane, ear canal and external ear normal.     Left Ear: Tympanic membrane, ear canal and external ear normal.     Nose: Congestion present.     Comments: Erythematous turbinates bilaterally.    Mouth/Throat:     Mouth: Mucous membranes are moist.     Pharynx: Oropharynx is clear. No oropharyngeal exudate or posterior oropharyngeal erythema.     Comments: No trismus, no torticollis, oropharynx visualized tongue uvula are both midline, controlling oral secretions, tonsils were equal symmetrical bilaterally, no oxygen or erythema present, no tongue elevation. Eyes:     Conjunctiva/sclera: Conjunctivae normal.  Cardiovascular:     Rate and Rhythm: Normal rate and regular rhythm.     Pulses: Normal pulses.     Heart sounds: No murmur heard.   No friction rub. No gallop.  Pulmonary:     Effort: No respiratory distress.     Breath sounds: No wheezing, rhonchi or rales.  Abdominal:     Palpations: Abdomen is soft.     Tenderness: There is no abdominal tenderness. There is no right CVA tenderness or left CVA tenderness.  Musculoskeletal:     Right lower leg: No edema.     Left lower leg: No edema.  Skin:    General: Skin is warm and dry.  Neurological:     Mental Status: He is alert.  Psychiatric:        Mood and Affect: Mood normal.    ED Results / Procedures / Treatments   Labs (all labs ordered are listed, but only abnormal results are displayed) Labs Reviewed  RESP PANEL BY RT-PCR (FLU A&B, COVID) ARPGX2 - Abnormal; Notable for the following components:      Result Value   Influenza A by PCR POSITIVE (*)    All other components within normal limits  GROUP A STREP BY PCR    EKG EKG Interpretation  Date/Time:  Monday October 04 2021 10:19:44 EST Ventricular Rate:  98 PR Interval:  130 QRS Duration: 91 QT Interval:  315 QTC Calculation: 403 R Axis:   91 Text Interpretation: Sinus rhythm Borderline right axis  deviation No significant change since prior 1/20 Confirmed by Aletta Edouard [phone]) on 10/04/2021 10:22:51 AM  Radiology DG Chest Port 1 View  Result Date: 10/04/2021 CLINICAL DATA:  Cough.  Body aches.  Sore throat.  Fever. EXAM: PORTABLE CHEST 1 VIEW COMPARISON:  11/12/2018 FINDINGS: The heart size and mediastinal contours are within normal limits. Both lungs are clear. The visualized skeletal structures are unremarkable. IMPRESSION: No active disease. Electronically Signed   By: Jan Fireman.D.  On: 10/04/2021 08:53    Procedures Procedures   Medications Ordered in ED Medications  acetaminophen (TYLENOL) tablet 650 mg (650 mg Oral Given 10/04/21 8675)    ED Course  I have reviewed the triage vital signs and the nursing notes.  Pertinent labs & imaging results that were available during my care of the patient were reviewed by me and considered in my medical decision making (see chart for details).    MDM Rules/Calculators/A&P                          Initial impression-presents with URI-like symptoms.  Alert, no acute distress, vital signs significant for tachycardia and a fever.  Likely viral nature, will obtain respiratory panel, chest x-ray, EKG and reassess.  Will provide with Tylenol.  Work-up-respiratory panel positive for influenza A, group B strep negative, chest x-ray unremarkable, EKG sinus without signs of ischemia  Rule out- Low suspicion for systemic infection as patient is nontoxic-appearing, vital signs were significant for tachycardia and fever but this is likely secondary due to the viral infection influenza a.  Low suspicion for pneumonia as lung sounds are clear bilaterally, x-ray did not reveal any acute findings.  I have low suspicion for PE as patient denies pleuritic chest pain, shortness of breath, no peripheral edema, presentation is more consistent with viral infection.  Low suspicion for strep throat as oropharynx was visualized, no erythema or exudates  noted.  Low suspicion patient would need  hospitalized due to viral infection or Covid as vital signs reassuring, patient is not in respiratory distress.  I have low suspicion for ACS as patient has low risk factors, EKG without signs of ischemia, presentation atypical etiology.   Plan-  Influenza A-we will recommend symptom management, have him follow-up with his PCP for further evaluation if needed.  Will defer antiviral treatment as he is not immunocompromise, he has very mild symptoms, low risk factors for adverse outcome.  Vital signs have remained stable, no indication for hospital admission.   Patient given at home care as well strict return precautions.  Patient verbalized that they understood agreed to said plan.  Final Clinical Impression(s) / ED Diagnoses Final diagnoses:  Influenza A    Rx / DC Orders ED Discharge Orders     None        Marcello Fennel, PA-C 10/04/21 1038    Hayden Rasmussen, MD 10/04/21 1840

## 2021-10-04 NOTE — ED Triage Notes (Signed)
Patient with complaints of flu like symptoms that started 5 days prior. Cough, body aches, sore throat, and fever.

## 2021-11-27 ENCOUNTER — Other Ambulatory Visit: Payer: Self-pay | Admitting: Family Medicine

## 2021-11-30 ENCOUNTER — Other Ambulatory Visit: Payer: Self-pay | Admitting: Family Medicine

## 2021-12-09 ENCOUNTER — Other Ambulatory Visit: Payer: Self-pay | Admitting: Family Medicine

## 2023-01-27 ENCOUNTER — Ambulatory Visit (INDEPENDENT_AMBULATORY_CARE_PROVIDER_SITE_OTHER): Payer: No Typology Code available for payment source

## 2023-01-27 ENCOUNTER — Ambulatory Visit (HOSPITAL_COMMUNITY)
Admission: RE | Admit: 2023-01-27 | Discharge: 2023-01-27 | Disposition: A | Discharge status: Home / Self Care | Payer: Self-pay | Source: Ambulatory Visit

## 2023-01-27 VITALS — BP 134/90 | HR 96 | Temp 97.9°F | Resp 18 | Ht 70.0 in | Wt 210.0 lb

## 2023-01-27 DIAGNOSIS — R109 Unspecified abdominal pain: Secondary | ICD-10-CM

## 2023-01-27 DIAGNOSIS — R42 Dizziness and giddiness: Secondary | ICD-10-CM | POA: Insufficient documentation

## 2023-01-27 DIAGNOSIS — G8929 Other chronic pain: Secondary | ICD-10-CM | POA: Insufficient documentation

## 2023-01-27 DIAGNOSIS — R32 Unspecified urinary incontinence: Secondary | ICD-10-CM | POA: Insufficient documentation

## 2023-01-27 DIAGNOSIS — R0602 Shortness of breath: Secondary | ICD-10-CM | POA: Insufficient documentation

## 2023-01-27 DIAGNOSIS — M545 Low back pain, unspecified: Secondary | ICD-10-CM

## 2023-01-27 DIAGNOSIS — K921 Melena: Secondary | ICD-10-CM | POA: Insufficient documentation

## 2023-01-27 LAB — CBC WITH DIFFERENTIAL/PLATELET
Abs Immature Granulocytes: 0.02 10*3/uL (ref 0.00–0.07)
Basophils Absolute: 0.1 10*3/uL (ref 0.0–0.1)
Basophils Relative: 1 %
Eosinophils Absolute: 0.1 10*3/uL (ref 0.0–0.5)
Eosinophils Relative: 1 %
HCT: 47.4 % (ref 39.0–52.0)
Hemoglobin: 16.1 g/dL (ref 13.0–17.0)
Immature Granulocytes: 0 %
Lymphocytes Relative: 47 %
Lymphs Abs: 2.8 10*3/uL (ref 0.7–4.0)
MCH: 29.9 pg (ref 26.0–34.0)
MCHC: 34 g/dL (ref 30.0–36.0)
MCV: 88.1 fL (ref 80.0–100.0)
Monocytes Absolute: 0.5 10*3/uL (ref 0.1–1.0)
Monocytes Relative: 9 %
Neutro Abs: 2.5 10*3/uL (ref 1.7–7.7)
Neutrophils Relative %: 42 %
Platelets: 311 10*3/uL (ref 150–400)
RBC: 5.38 MIL/uL (ref 4.22–5.81)
RDW: 11.9 % (ref 11.5–15.5)
WBC: 6 10*3/uL (ref 4.0–10.5)
nRBC: 0 % (ref 0.0–0.2)

## 2023-01-27 LAB — COMPREHENSIVE METABOLIC PANEL
ALT: 55 U/L — ABNORMAL HIGH (ref 0–44)
AST: 33 U/L (ref 15–41)
Albumin: 3.8 g/dL (ref 3.5–5.0)
Alkaline Phosphatase: 66 U/L (ref 38–126)
Anion gap: 8 (ref 5–15)
BUN: 11 mg/dL (ref 6–20)
CO2: 24 mmol/L (ref 22–32)
Calcium: 9 mg/dL (ref 8.9–10.3)
Chloride: 104 mmol/L (ref 98–111)
Creatinine, Ser: 1.29 mg/dL — ABNORMAL HIGH (ref 0.61–1.24)
GFR, Estimated: >60 mL/min (ref >60)
Glucose, Bld: 78 mg/dL (ref 70–99)
Potassium: 4 mmol/L (ref 3.5–5.1)
Sodium: 136 mmol/L (ref 135–145)
Total Bilirubin: 0.5 mg/dL (ref 0.3–1.2)
Total Protein: 6.8 g/dL (ref 6.5–8.1)

## 2023-01-27 LAB — POCT URINALYSIS DIPSTICK, ED / UC
Bilirubin Urine: NEGATIVE
Glucose, UA: NEGATIVE mg/dL
Hgb urine dipstick: NEGATIVE
Ketones, ur: NEGATIVE mg/dL
Leukocytes,Ua: NEGATIVE
Nitrite: NEGATIVE
Protein, ur: NEGATIVE mg/dL
Specific Gravity, Urine: >=1.03 (ref 1.005–1.030)
Urobilinogen, UA: 1 mg/dL (ref 0.0–1.0)
pH: 6.5 (ref 5.0–8.0)

## 2023-01-27 LAB — TSH: TSH: 0.626 u[IU]/mL (ref 0.350–4.500)

## 2023-01-27 LAB — POC OCCULT BLOOD, ED: Fecal Occult Bld: NEGATIVE

## 2023-01-27 LAB — HIV ANTIBODY (ROUTINE TESTING W REFLEX): HIV Screen 4th Generation wRfx: NONREACTIVE

## 2023-01-27 LAB — C-REACTIVE PROTEIN: CRP: 0.5 mg/dL (ref <1.0)

## 2023-01-27 NOTE — ED Triage Notes (Addendum)
Abdominal pain onset few months ago. States the pain has been getting worse. Patient having nausea, emesis, and diarrhea. No changes in diet or meds, no one with similar symptoms. No history of GI issues. Will go from constipation to diarrhea with blood in the stool.   Urinary incontinence. States will go to the bathroom normal and later will just urinate on himself with no urgency. No painful urination.

## 2023-01-27 NOTE — Discharge Instructions (Signed)
Your urine was normal.  There was no blood in your stool.  Your x-rays were reassuring.  I will contact you with your lab work as I get these results.  I recommend that you follow-up with a GI specialist.  Please call them to schedule an appointment.  I also like you to follow-up with a heart specialist.  Someone should call you to schedule an appointment with a primary care provider so please follow-up with them as well.  If you have any worsening or changing symptoms including severe abdominal pain, increasing blood in your stool, lightheadedness, passing out, chest pain, increasing shortness of breath, weakness you need to go to the emergency room.

## 2023-01-27 NOTE — ED Provider Notes (Signed)
[facility] [place]    CSN: [id number] Arrival date & time: 01/27/23  1816      History   Chief Complaint Chief Complaint  Patient presents with   Abdominal Pain   Appointment    HPI Gregg Crawford is a 27 y.o. male.   Patient presents today with a year-long history of intermittent abdominal and urinary symptoms.  He reports that for the past 6 months he has had blood and mucus in his stool.  He is able to provide pictures of blood streaks in his stool.  He denies any recent travel or change in his diet.  He does not take NSAIDs on a regular basis or drink alcohol.  Denies any recent medication changes.  He has not tried any over-the-counter medication for symptom management.  Denies previous abdominal surgery and still has gallbladder and appendix.  Reports that his stool fluctuates between constipation and diarrhea.  He reports associated nausea but denies regular vomiting.  He has never seen a GI specialist.  Denies history of ulcerative colitis or Crohn's disease in himself or his family.  Denies personal or family history of autoimmune condition.  He does report that over the past several months he has also had urinary incontinence.  This will occur randomly 1 or 2 times per day.  He denies any associated dysuria, penile lesions, urgency, frequency.  He does have some lower back pain but works a physically demanding job and has attributed intermittent back pain to this.  He denies any bowel incontinence or retention or saddle anesthesia.  Denies any previous spinal surgeries.  He does report that occasionally he will have shortness of breath particularly with changing positions.  He reports associated lightheadedness but denies any syncope as well as intermittent palpitations.  He does not currently have a primary care provider.  Denies any history of diabetes or thyroid condition.  He has not identified any specific trigger and reports that symptoms are constant and unchanging since  onset.    Past Medical History:  Diagnosis Date   Head injury 05/2010   reacurrent sxs dizziness, headaches,confusion had a negative MRI and EEG   Psychiatric complaint    Viral encephalitis     Patient Active Problem List   Diagnosis Date Noted   Umbilical hernia [id number]   Physical exam 12/09/2015   Smokeless tobacco use 12/01/2011    Past Surgical History:  Procedure Laterality Date   FRACTURE SURGERY     fracture bilateral ankles       Home Medications    Prior to Admission medications   Medication Sig Start Date End Date Taking? Authorizing Provider  atomoxetine (STRATTERA) 40 MG capsule Take 40 mg by mouth daily. 01/11/23  Yes [provider]  buPROPion (WELLBUTRIN XL) 300 MG 24 hr tablet Take 300 mg by mouth every morning. 01/11/23  Yes [provider]  busPIRone (BUSPAR) 15 MG tablet Take 15 mg by mouth 2 (two) times daily. 01/11/23  Yes [provider]  QUEtiapine (SEROQUEL) 100 MG tablet Take 100 mg by mouth daily. 01/11/23  Yes [provider]  escitalopram (LEXAPRO) 20 MG tablet Take 1 tablet (20 mg total) by mouth daily. 01/24/19 05/13/19  Norman Clay, MD    Family History Family History  Problem Relation Age of Onset   Hypertension Father    Diabetes Maternal Grandfather    Early death Maternal Grandfather    Hypertension Maternal Grandfather    Heart disease Maternal Grandfather    Melanoma  Mother     Social History Social History   Tobacco Use   Smoking status: Never   Smokeless tobacco: Current    Types: Chew   Tobacco comments:    dips  Vaping Use   Vaping Use: Never used  Substance Use Topics   Alcohol use: Yes    Alcohol/week: 10.0 standard drinks of alcohol    Types: 10 Cans of beer per week    Comment: was drinking 1-2 beers daily, last drink was 10/13/18   Drug use: Yes    Frequency: 1.0 times per week    Types: Marijuana    Comment: no use of marijuana in a month     Allergies   Patient  has no known allergies.   Review of Systems Review of Systems  Constitutional:  Positive for activity change, chills and diaphoresis. Negative for appetite change, fatigue and fever.  Respiratory:  Positive for shortness of breath. Negative for cough.   Cardiovascular:  Positive for palpitations. Negative for chest pain and leg swelling.  Gastrointestinal:  Positive for abdominal pain, blood in stool, constipation, diarrhea and nausea. Negative for vomiting.  Neurological:  Positive for light-headedness. Negative for dizziness, syncope, speech difficulty, weakness, numbness and headaches.     Physical Exam Triage Vital Signs ED Triage Vitals  Enc Vitals Group     BP 01/27/23 1833 (!) 134/90     Pulse Rate 01/27/23 1833 96     Resp 01/27/23 1833 18     Temp 01/27/23 1833 97.9 F (36.6 C)     Temp Source 01/27/23 1833 Oral     SpO2 01/27/23 1833 98 %     Weight 01/27/23 1832 210 lb (95.3 kg)     Height 01/27/23 1832 5\' 10"  (1.778 m)     Head Circumference --      Peak Flow --      Pain Score 01/27/23 1829 4     Pain Loc --      Pain Edu? --      Excl. in [place]? --    No data found.  Updated Vital Signs BP (!) 134/90 (BP Location: Right Arm)   Pulse 96   Temp 97.9 F (36.6 C) (Oral)   Resp 18   Ht 5\' 10"  (1.778 m)   Wt 210 lb (95.3 kg)   SpO2 98%   BMI 30.13 kg/m   Visual Acuity Right Eye Distance:   Left Eye Distance:   Bilateral Distance:    Right Eye Near:   Left Eye Near:    Bilateral Near:     Physical Exam Vitals reviewed.  Constitutional:      General: He is awake.     Appearance: Normal appearance. He is well-developed. He is not ill-appearing.     Comments: Very pleasant male appears stated age in no acute distress sitting comfortably in exam room  HENT:     Head: Normocephalic and atraumatic.     Right Ear: Tympanic membrane, ear canal and external ear normal. No hemotympanum.     Left Ear: Tympanic membrane, ear canal and external ear normal. No  hemotympanum.     Nose: Nose normal.     Mouth/Throat:     Pharynx: Uvula midline. No oropharyngeal exudate or posterior oropharyngeal erythema.  Eyes:     Extraocular Movements: Extraocular movements intact.     Conjunctiva/sclera: Conjunctivae normal.     Pupils: Pupils are equal, round, and reactive to light.  Cardiovascular:  Rate and Rhythm: Normal rate and regular rhythm.     Heart sounds: Normal heart sounds, S1 normal and S2 normal. No murmur heard. Pulmonary:     Effort: Pulmonary effort is normal. No accessory muscle usage or respiratory distress.     Breath sounds: Normal breath sounds. No stridor. No wheezing, rhonchi or rales.     Comments: Clear to auscultation bilaterally Abdominal:     General: Bowel sounds are normal.     Palpations: Abdomen is soft.     Tenderness: There is generalized abdominal tenderness. There is no right CVA tenderness, left CVA tenderness, guarding or rebound.       Comments: Mild tenderness palpation throughout abdomen.  No focal tenderness.  No evidence of acute abdomen on physical exam.  Genitourinary:    Rectum: No anal fissure, external hemorrhoid or internal hemorrhoid. Normal anal tone.     Comments: Dee, CMA present as chaperone during exam Musculoskeletal:     Cervical back: Normal range of motion and neck supple. No tenderness or bony tenderness.     Thoracic back: No tenderness or bony tenderness.     Lumbar back: Tenderness and bony tenderness present. Negative right straight leg raise test and negative left straight leg raise test.     Comments: Pain with percussion of lumbar vertebrae.  Tenderness to palpation of bilateral lumbar paraspinal muscles.  Strength 5/5 bilateral upper and lower extremities.  Neurological:     General: No focal deficit present.     Mental Status: He is alert and oriented to person, place, and time.     Cranial Nerves: Cranial nerves 2-12 are intact.     Motor: Motor function is intact.      Coordination: Coordination is intact.     Gait: Gait is intact.  Psychiatric:        Behavior: Behavior is cooperative.      UC Treatments / Results  Labs (all labs ordered are listed, but only abnormal results are displayed) Labs Reviewed  OCCULT BLOOD X 1 CARD TO LAB, STOOL  CBC WITH DIFFERENTIAL/PLATELET  COMPREHENSIVE METABOLIC PANEL  HIV ANTIBODY (ROUTINE TESTING W REFLEX)  TSH  C-REACTIVE PROTEIN  ANCA TITERS  POCT URINALYSIS DIPSTICK, ED / UC  POC OCCULT BLOOD, ED    EKG   Radiology DG Abd Acute W/Chest  Result Date: 01/27/2023 CLINICAL DATA:  Abdominal pain for a few months, initial encounter EXAM: DG ABDOMEN ACUTE WITH 1 VIEW CHEST COMPARISON:  10/04/2021 FINDINGS: Cardiac shadows within normal limits. The lungs are well aerated bilaterally. No focal infiltrate or effusion is seen. Scattered large and small bowel gas is noted. No obstructive changes are noted. No free air is seen. No abnormal mass or abnormal calcifications are noted. The bony structures are within normal limits. IMPRESSION: No acute abnormality noted. Electronically Signed   By: Inez Catalina M.D.   On: 01/27/2023 19:53   DG Lumbar Spine Complete  Result Date: 01/27/2023 CLINICAL DATA:  Low back pain for several months, initial encounter EXAM: LUMBAR SPINE - COMPLETE 4+ VIEW COMPARISON:  08/09/2007 FINDINGS: Five lumbar type vertebral bodies are well visualized. Vertebral body height is well maintained. No pars defects are seen. No soft tissue abnormality is noted. IMPRESSION: No acute abnormality noted. Electronically Signed   By: Inez Catalina M.D.   On: 01/27/2023 19:52    Procedures Procedures (including critical care time)  Medications Ordered in UC Medications - No data to display  Initial Impression / Assessment and Plan /  UC Course  I have reviewed the triage vital signs and the nursing notes.  Pertinent labs & imaging results that were available during my care of the patient were reviewed  by me and considered in my medical decision making (see chart for details).     Patient is well-appearing, afebrile, nontoxic, nontachycardic.  Hemoccult testing was negative in clinic but patient is able to produce pictures of blood in stool.  I am concerned for inflammatory bowel disease and so basic labs including CBC, CMP, CRP, ANCA, HIV antibody testing was obtained.  We discussed that ultimately he will need to see a GI specialist for further evaluation and management was given contact information for local provider.  His abdominal exam was benign today but we discussed that if he has any worsening symptoms he needs to go to the emergency room for further evaluation and management.  Given he has had intermittent night sweats acute abdomen with chest x-ray was obtained that showed no evidence of TB on checks x-ray and no obstruction on KUB.  Low suspicion for infectious etiology given patient denies any significant risk factors and this has been ongoing for many months up to a year.  Stool testing was deferred for the time being.  Patient reports intermittent lightheadedness, shortness of breath, palpitations.  He is otherwise well-appearing and nontachycardic in clinic.  CBC, TSH, CMP obtained today.  EKG was obtained given this constellation of symptoms which showed normal sinus rhythm with ventricular rate of 89 bpm without ischemic changes; no previous to compare.  We discussed that given his symptoms it would be worthwhile to follow-up with cardiologist and was given contact information for local provider with instruction to call to schedule an appointment.  If he has any worsening or changing symptoms he needs to be seen immediately to which he expressed understanding.  Patient reports a 40-month 1 year history of urinary incontinence.  Urine was slightly concentrated but otherwise normal with no evidence of infection.  He does have some lower back pain and so x-rays were obtained that showed no  acute abnormality.  He had normal rectal tone and denies any saddle anesthesia so low suspicion for cauda equina at this time.  We did discuss that he would need further evaluation and will need to follow-up with primary care to consider MRI and/or referral.  He does not currently have a primary care so we will establish him with someone via PCP assistance.  Had a long conversation with patient that I am concerned about his constellation of symptoms.  We discussed that our give abilities are limited in urgent care and we will initiate the workup it is very important that he follows up with primary care and specialists for further evaluation and management.  Patient is agreeable and was given contact information for both cardiology and GI with instruction to call to schedule an appointment.  We will have nursing staff contact him to help schedule an appointment with primary care via PCP assistance.  We discussed that if he has any worsening or changing symptoms he needs to be seen immediately.  Final Clinical Impressions(s) / UC Diagnoses   Final diagnoses:  Blood in stool  Episodic lightheadedness  Shortness of breath  Urinary incontinence, unspecified type  Chronic bilateral low back pain without sciatica     Discharge Instructions      Your urine was normal.  There was no blood in your stool.  Your x-rays were reassuring.  I will contact you with  your lab work as I get these results.  I recommend that you follow-up with a GI specialist.  Please call them to schedule an appointment.  I also like you to follow-up with a heart specialist.  Someone should call you to schedule an appointment with a primary care provider so please follow-up with them as well.  If you have any worsening or changing symptoms including severe abdominal pain, increasing blood in your stool, lightheadedness, passing out, chest pain, increasing shortness of breath, weakness you need to go to the emergency room.     ED  Prescriptions   None    PDMP not reviewed this encounter.   Terrilee Croak, PA-C 01/27/23 2042

## 2023-01-30 LAB — ANCA TITERS
Atypical P-ANCA titer: <1:20 {titer}
C-ANCA: <1:20 {titer}
P-ANCA: <1:20 {titer}

## 2023-02-23 ENCOUNTER — Encounter (HOSPITAL_COMMUNITY): Payer: Self-pay

## 2023-04-21 ENCOUNTER — Ambulatory Visit (HOSPITAL_COMMUNITY)
Admission: EM | Admit: 2023-04-21 | Discharge: 2023-04-21 | Disposition: A | Discharge status: Home / Self Care | Payer: Self-pay | Attending: Family Medicine | Admitting: Family Medicine

## 2023-04-21 ENCOUNTER — Encounter (HOSPITAL_COMMUNITY): Payer: Self-pay | Admitting: *Deleted

## 2023-04-21 DIAGNOSIS — S81811A Laceration without foreign body, right lower leg, initial encounter: Secondary | ICD-10-CM

## 2023-04-21 DIAGNOSIS — Z23 Encounter for immunization: Secondary | ICD-10-CM

## 2023-04-21 MED ORDER — TETANUS-DIPHTH-ACELL PERTUSSIS 5-2.5-18.5 LF-MCG/0.5 IM SUSY
PREFILLED_SYRINGE | INTRAMUSCULAR | Status: AC
  Filled 2023-04-21: qty 0.5

## 2023-04-21 MED ORDER — TETANUS-DIPHTH-ACELL PERTUSSIS 5-2.5-18.5 LF-MCG/0.5 IM SUSY
0.5000 mL | PREFILLED_SYRINGE | Freq: Once | INTRAMUSCULAR | Status: AC
  Administered 2023-04-21: 0.5 mL via INTRAMUSCULAR

## 2023-04-21 NOTE — Discharge Instructions (Signed)
2 times daily wash the cut with soapy water and put new antibiotic ointment on.  Keep the bandage on it all the time if you want, especially if you are going to be in a dirty environment.  There is 6 stitches placed.  They need to be removed in 7 to 10 days.  You can return here if you want to or you can see your primary care for suture removal.  Ice and elevate the area today  You have been given a Tdap vaccination to boost your tetanus immunity

## 2023-04-21 NOTE — ED Triage Notes (Signed)
Pt states he cut his right lower leg and street bike peg last night about 10:30pm. He did put peroxide on it last night. His last TDAP was 2018

## 2023-04-21 NOTE — ED Provider Notes (Addendum)
[facility]    CSN: [id number] Arrival date & time: 04/21/23  1207      History   Chief Complaint Chief Complaint  Patient presents with   Leg Injury    Cut shin open and think it's going to need a few stitches - Entered by patient    HPI Gregg Crawford is a 27 y.o. male.   HPI Here for a cut on his right lower leg over his shin.  Last evening about 10:30 PM (about 15 hours prior to this visit/repair) he was loading a bike onto a truck when a peg from the bike cut his chin.  He controlled the bleeding and comes in today for evaluation.  He is not allergic to any medications  Last tetanus shot was 2018    Past Medical History:  Diagnosis Date   Head injury 05/2010   reacurrent sxs dizziness, headaches,confusion had a negative MRI and EEG   Psychiatric complaint    Viral encephalitis     Patient Active Problem List   Diagnosis Date Noted   Umbilical hernia 07/11/2017   Physical exam 12/09/2015   Smokeless tobacco use 12/01/2011    Past Surgical History:  Procedure Laterality Date   FRACTURE SURGERY     fracture bilateral ankles       Home Medications    Prior to Admission medications   Medication Sig Start Date End Date Taking? Authorizing Provider  atomoxetine (STRATTERA) 40 MG capsule Take 40 mg by mouth daily. 01/11/23  Yes [provider]  buPROPion (WELLBUTRIN XL) 300 MG 24 hr tablet Take 300 mg by mouth every morning. 01/11/23  Yes [provider]  busPIRone (BUSPAR) 15 MG tablet Take 15 mg by mouth 2 (two) times daily. 01/11/23  Yes [provider]  QUEtiapine (SEROQUEL) 100 MG tablet Take 100 mg by mouth daily. 01/11/23  Yes [provider]  escitalopram (LEXAPRO) 20 MG tablet Take 1 tablet (20 mg total) by mouth daily. 01/24/19 05/13/19  Neysa Hotter, MD    Family History Family History  Problem Relation Age of Onset   Hypertension Father    Diabetes Maternal Grandfather    Early death Maternal  Grandfather    Hypertension Maternal Grandfather    Heart disease Maternal Grandfather    Melanoma Mother     Social History Social History   Tobacco Use   Smoking status: Never   Smokeless tobacco: Current    Types: Chew   Tobacco comments:    dips  Vaping Use   Vaping Use: Never used  Substance Use Topics   Alcohol use: Yes    Alcohol/week: 10.0 standard drinks of alcohol    Types: 10 Cans of beer per week    Comment: was drinking 1-2 beers daily, last drink was 10/13/18   Drug use: Not Currently    Frequency: 1.0 times per week    Types: Marijuana     Allergies   Patient has no known allergies.   Review of Systems Review of Systems   Physical Exam Triage Vital Signs ED Triage Vitals  Enc Vitals Group     BP 04/21/23 1315 (!) 143/87     Pulse Rate 04/21/23 1315 (!) 117     Resp 04/21/23 1315 18     Temp 04/21/23 1315 98.4 F (36.9 C)     Temp Source 04/21/23 1315 Oral     SpO2 04/21/23 1315 98 %     Weight --  Height --      Head Circumference --      Peak Flow --      Pain Score 04/21/23 1314 0     Pain Loc --      Pain Edu? --      Excl. in [place]? --    No data found.  Updated Vital Signs BP (!) 143/87 (BP Location: Right Arm)   Pulse (!) 117   Temp 98.4 F (36.9 C) (Oral)   Resp 18   SpO2 98%   Visual Acuity Right Eye Distance:   Left Eye Distance:   Bilateral Distance:    Right Eye Near:   Left Eye Near:    Bilateral Near:     Physical Exam Vitals reviewed.  Constitutional:      General: He is not in acute distress.    Appearance: He is not ill-appearing, toxic-appearing or diaphoretic.  Skin:    Coloration: Skin is not jaundiced or pale.     Comments: There is a 3 cm horizontal laceration over the anterior right shin.  Bleeding is controlled and there is a little bit of clotted blood in the wound bed.  There is no surrounding erythema or drainage.  Neurological:     Mental Status: He is alert and oriented to person, place,  and time.  Psychiatric:        Behavior: Behavior normal.      UC Treatments / Results  Labs (all labs ordered are listed, but only abnormal results are displayed) Labs Reviewed - No data to display  EKG   Radiology No results found.  Procedures Procedures (including critical care time)  Medications Ordered in UC Medications  Tdap (BOOSTRIX) injection 0.5 mL (has no administration in time range)    Initial Impression / Assessment and Plan / UC Course  I have reviewed the triage vital signs and the nursing notes.  Pertinent labs & imaging results that were available during my care of the patient were reviewed by me and considered in my medical decision making (see chart for details).        After informed verbal consent is obtained, 1% lidocaine with epinephrine is used for infiltration anesthesia. Good anesthesia result is obtained.  30 cc of normal saline is used to irrigate the wound and the wound is debrided of the dried blood in the wound bed.  Under clean conditions 4-0 Prolene is used to place 6 sutures with good approximation of the skin edges and good hemostasis.  EBL 1 mL.  No complications.  Dressing is applied and wound care is explained.  Tdap is given to boost his tetanus immunity.   Final Clinical Impressions(s) / UC Diagnoses   Final diagnoses:  Laceration of right lower leg, initial encounter     Discharge Instructions      2 times daily wash the cut with soapy water and put new antibiotic ointment on.  Keep the bandage on it all the time if you want, especially if you are going to be in a dirty environment.  There is 6 stitches placed.  They need to be removed in 7 to 10 days.  You can return here if you want to or you can see your primary care for suture removal.  Ice and elevate the area today  You have been given a Tdap vaccination to boost your tetanus immunity      ED Prescriptions   None    PDMP not reviewed this  encounter.  Zenia Resides, MD 04/21/23 1359    Zenia Resides, MD 04/21/23 [phone]

## 2023-07-17 ENCOUNTER — Ambulatory Visit (HOSPITAL_COMMUNITY)
Admission: EM | Admit: 2023-07-17 | Discharge: 2023-07-17 | Disposition: A | Discharge status: Home / Self Care | Payer: Self-pay | Attending: Family Medicine | Admitting: Family Medicine

## 2023-07-17 NOTE — ED Triage Notes (Signed)
PT called from front lobby with no answer

## 2023-10-23 ENCOUNTER — Ambulatory Visit (HOSPITAL_COMMUNITY): Payer: Self-pay

## 2023-11-23 ENCOUNTER — Inpatient Hospital Stay (HOSPITAL_COMMUNITY)
Admission: AD | Admit: 2023-11-23 | Discharge: 2023-11-28 | DRG: 885 | Disposition: A | Discharge status: Home / Self Care | Payer: No Typology Code available for payment source | Source: Intra-hospital | Attending: Psychiatry | Admitting: Psychiatry

## 2023-11-23 ENCOUNTER — Other Ambulatory Visit: Payer: Self-pay

## 2023-11-23 ENCOUNTER — Ambulatory Visit (HOSPITAL_COMMUNITY)
Admission: EM | Admit: 2023-11-23 | Discharge: 2023-11-23 | Disposition: A | Discharge status: Other Acute Inpatient Hospital | Payer: Self-pay | Attending: Behavioral Health | Admitting: Behavioral Health

## 2023-11-23 DIAGNOSIS — F1520 Other stimulant dependence, uncomplicated: Secondary | ICD-10-CM | POA: Diagnosis present

## 2023-11-23 DIAGNOSIS — F203 Undifferentiated schizophrenia: Secondary | ICD-10-CM | POA: Diagnosis not present

## 2023-11-23 DIAGNOSIS — F32A Depression, unspecified: Secondary | ICD-10-CM | POA: Diagnosis present

## 2023-11-23 DIAGNOSIS — F209 Schizophrenia, unspecified: Principal | ICD-10-CM | POA: Diagnosis present

## 2023-11-23 DIAGNOSIS — Z91148 Patient's other noncompliance with medication regimen for other reason: Secondary | ICD-10-CM

## 2023-11-23 DIAGNOSIS — Z8249 Family history of ischemic heart disease and other diseases of the circulatory system: Secondary | ICD-10-CM | POA: Diagnosis not present

## 2023-11-23 DIAGNOSIS — Z79899 Other long term (current) drug therapy: Secondary | ICD-10-CM | POA: Diagnosis not present

## 2023-11-23 DIAGNOSIS — F329 Major depressive disorder, single episode, unspecified: Secondary | ICD-10-CM | POA: Insufficient documentation

## 2023-11-23 DIAGNOSIS — Z91128 Patient's intentional underdosing of medication regimen for other reason: Secondary | ICD-10-CM | POA: Insufficient documentation

## 2023-11-23 DIAGNOSIS — F419 Anxiety disorder, unspecified: Secondary | ICD-10-CM | POA: Diagnosis present

## 2023-11-23 DIAGNOSIS — Z5971 Insufficient health insurance coverage: Secondary | ICD-10-CM

## 2023-11-23 DIAGNOSIS — T43596A Underdosing of other antipsychotics and neuroleptics, initial encounter: Secondary | ICD-10-CM | POA: Insufficient documentation

## 2023-11-23 DIAGNOSIS — Z833 Family history of diabetes mellitus: Secondary | ICD-10-CM

## 2023-11-23 DIAGNOSIS — F1511 Other stimulant abuse, in remission: Secondary | ICD-10-CM | POA: Insufficient documentation

## 2023-11-23 LAB — CBC WITH DIFFERENTIAL/PLATELET
Abs Immature Granulocytes: 0.02 10*3/uL (ref 0.00–0.07)
Basophils Absolute: 0.1 10*3/uL (ref 0.0–0.1)
Basophils Relative: 2 %
Eosinophils Absolute: 0.1 10*3/uL (ref 0.0–0.5)
Eosinophils Relative: 1 %
HCT: 44 % (ref 39.0–52.0)
Hemoglobin: 14.8 g/dL (ref 13.0–17.0)
Immature Granulocytes: 0 %
Lymphocytes Relative: 43 %
Lymphs Abs: 2.3 10*3/uL (ref 0.7–4.0)
MCH: 29.8 pg (ref 26.0–34.0)
MCHC: 33.6 g/dL (ref 30.0–36.0)
MCV: 88.7 fL (ref 80.0–100.0)
Monocytes Absolute: 0.6 10*3/uL (ref 0.1–1.0)
Monocytes Relative: 11 %
Neutro Abs: 2.2 10*3/uL (ref 1.7–7.7)
Neutrophils Relative %: 43 %
Platelets: 256 10*3/uL (ref 150–400)
RBC: 4.96 MIL/uL (ref 4.22–5.81)
RDW: 12.3 % (ref 11.5–15.5)
WBC: 5.2 10*3/uL (ref 4.0–10.5)
nRBC: 0 % (ref 0.0–0.2)

## 2023-11-23 LAB — HEMOGLOBIN A1C
Hgb A1c MFr Bld: 5.1 % (ref 4.8–5.6)
Mean Plasma Glucose: 99.67 mg/dL

## 2023-11-23 LAB — COMPREHENSIVE METABOLIC PANEL
ALT: 36 U/L (ref 0–44)
AST: 25 U/L (ref 15–41)
Albumin: 3.8 g/dL (ref 3.5–5.0)
Alkaline Phosphatase: 81 U/L (ref 38–126)
Anion gap: 6 (ref 5–15)
BUN: 10 mg/dL (ref 6–20)
CO2: 29 mmol/L (ref 22–32)
Calcium: 9.3 mg/dL (ref 8.9–10.3)
Chloride: 104 mmol/L (ref 98–111)
Creatinine, Ser: 1.15 mg/dL (ref 0.61–1.24)
GFR, Estimated: >60 mL/min (ref >60)
Glucose, Bld: 86 mg/dL (ref 70–99)
Potassium: 4.1 mmol/L (ref 3.5–5.1)
Sodium: 139 mmol/L (ref 135–145)
Total Bilirubin: 0.4 mg/dL (ref 0.0–1.2)
Total Protein: 6.8 g/dL (ref 6.5–8.1)

## 2023-11-23 LAB — TSH: TSH: 1.451 u[IU]/mL (ref 0.350–4.500)

## 2023-11-23 LAB — HEPATITIS PANEL, ACUTE
HCV Ab: NONREACTIVE
Hep A IgM: NONREACTIVE
Hep B C IgM: NONREACTIVE
Hepatitis B Surface Ag: NONREACTIVE

## 2023-11-23 LAB — LIPID PANEL
Cholesterol: 166 mg/dL (ref 0–200)
HDL: 52 mg/dL (ref >40)
LDL Cholesterol: 96 mg/dL (ref 0–99)
Total CHOL/HDL Ratio: 3.2 {ratio}
Triglycerides: 89 mg/dL (ref <150)
VLDL: 18 mg/dL (ref 0–40)

## 2023-11-23 LAB — POCT URINE DRUG SCREEN - MANUAL ENTRY (I-SCREEN)
POC Amphetamine UR: NOT DETECTED
POC Buprenorphine (BUP): NOT DETECTED
POC Cocaine UR: NOT DETECTED
POC Marijuana UR: POSITIVE — AB
POC Methadone UR: NOT DETECTED
POC Methamphetamine UR: POSITIVE — AB
POC Morphine: NOT DETECTED
POC Oxazepam (BZO): NOT DETECTED
POC Oxycodone UR: NOT DETECTED
POC Secobarbital (BAR): NOT DETECTED

## 2023-11-23 LAB — HIV ANTIBODY (ROUTINE TESTING W REFLEX): HIV Screen 4th Generation wRfx: NONREACTIVE

## 2023-11-23 MED ORDER — OLANZAPINE 5 MG PO TBDP
5.0000 mg | ORAL_TABLET | Freq: Once | ORAL | Status: AC
  Administered 2023-11-23: 5 mg via ORAL
  Filled 2023-11-23: qty 1

## 2023-11-23 MED ORDER — ALUM & MAG HYDROXIDE-SIMETH 200-200-20 MG/5ML PO SUSP: 30.0000 mL | ORAL | Status: DC | PRN

## 2023-11-23 MED ORDER — HYDROXYZINE HCL 25 MG PO TABS: 25.0000 mg | ORAL_TABLET | Freq: Three times a day (TID) | ORAL | Status: DC | PRN

## 2023-11-23 MED ORDER — MAGNESIUM HYDROXIDE 400 MG/5ML PO SUSP: 30.0000 mL | Freq: Every day | ORAL | Status: DC | PRN

## 2023-11-23 MED ORDER — ACETAMINOPHEN 325 MG PO TABS: 650.0000 mg | ORAL_TABLET | Freq: Four times a day (QID) | ORAL | Status: DC | PRN

## 2023-11-23 MED ORDER — OLANZAPINE 5 MG PO TBDP
5.0000 mg | ORAL_TABLET | Freq: Every day | ORAL | Status: DC
  Administered 2023-11-23: 5 mg via ORAL
  Filled 2023-11-23: qty 1

## 2023-11-23 NOTE — ED Notes (Signed)
Patient is A&O x4, calm and cooperative. Patient appears apprehensive about being in the facility. Patient endorses [facility] stating he hears  very clear voices constantly telling him many things. Patient states he feels as if the voices are broadcasting his life for all to hear. Patient denies SI< HI< VH and does not appear to be responding to internal stimuli.

## 2023-11-23 NOTE — ED Provider Notes (Signed)
Behavioral Health Urgent Care Medical Screening Exam  Patient Name: Gregg Crawford MRN: [id number] Date of Evaluation: 11/23/23  Chief Complaint:  Gregg Crawford presents to [facility] voluntarily accompanied by a stepdad. Pt states that he is hearing voices nostop. Pt denies SI, HI, VH and alcohol/drug use at this time. Pt endorses AH staing that he can make out what they are say & right now they are telling him he shouldn't be here discussing this.   Diagnosis:  Final diagnoses:  Schizophrenia, unspecified type (HCC)    History of Present illness: Gregg Crawford is a 28 y.o. male patient with a past psychiatric history significant for MDD, schizophrenia and methamphetamine abuse who presents to the [facility] voluntary accompanied by his father Garlan Fair [phone] with complaints of auditory hallucinations.   Patient reports experiencing auditory hallucinations since last summer that has slowly become nonstop over the past 3 to 4 months. He describes the auditory hallucinations as a male and male voice talking in sentences. He states that the voices will say things that are not true and that they are constantly playing a game to break him down, gain his trust and then break him back down. He states that the voices are currently telling him that he is not supposed to be here talking to you.   He reports disassociating and states that everything around him changes. His father describes it as a "life and death battle." His father also states that the patient has not sleep in 3 to 4 days. Patient states that he did not sleep Sunday - Tuesday because the voices were telling him that someone is going to kill when he goes to sleep. He states that he stayed up all night because he thought someone was going to kill him. He reports sleeping last night after taking his Seroquel.   He reports noncompliance with taking his medications because the voices tell him  not to take the medications and that someone is going to kill him. He states that the Seroquel makes him sleepy and that he's afraid to go to sleep because someone is trying to kill him. He reports that he is prescribed medications by [facility] and is prescribed BuSpar 15 mg twice daily, Wellbutrin 300 mg daily, Strattera 30 to 40 mg and Seroquel 150 mg at bedtime.   Patient currently denies visual hallucinations. However, he reports that two nights ago right before bed he felt like he was in hell and that the house was on fire, he was seeing snakes and people were trying to kill him all night until 6 or 7 am.  His father states that yesterday while the patient was visiting [place] he told the patient to stop by and see him, however the patient went to the airport looking for him and he told the patient that he was at the shop. He states that when the patient came by the shop he thought that his father had been shot and appeared to be confused saying that he father) was texting him. He states that he showed the patient his phone and that he was not texting him.  Patient endorses depressive symptoms of sadness, isolating, stating that he has pushed his friends away and is afraid to go out of the house, put sheets around his windows, and that the voices tell him that he is hopeless and worthless. He denies SI/HI. He denies past suicide attempts. The patient's father states that the patient has  been struggling to provide for his family for the past year. Patient expresses that he has been behind on his bills and has been feeling "blah."    Patient lives in [place] with his girlfriend and her 62-year-old child. Patient has a 35-year-old child who does not reside with him. Patient is self-employed and own a Systems analyst. Patient denies using illicit drugs. He reports a history of methamphetamine abuse and states that he has been clean for 1 to 2 years and completely clean for 3 to 4 months, stating that he has  only used a tiny bit prior to that. He denies drinking alcohol. He reports 1 inpatient psychiatric hospitalization at [facility] in 2020. Patient denies medical history.   On evaluation, patient is calm and cooperative and does not appear to be in acute distress. He is alert and oriented to person place and time. His thought process is linear with paranoia and delusional thought content. He endorses auditory hallucinations. He denies VH. He has fair eye-contact. He is causally dressed.    Flowsheet Row ED from 11/23/2023 in [facility] ED from 04/21/2023 in [facility] at [facility] ED from 01/27/2023 in [facility] at [facility] RISK CATEGORY No Risk No Risk No Risk       Psychiatric Specialty Exam  Presentation  General Appearance:Appropriate for Environment  Eye Contact:Fair  Speech:Clear and Coherent  Speech Volume:Normal  Handedness:Right   Mood and Affect  Mood:Anxious  Affect:Congruent   Thought Process  Thought Processes:Coherent  Descriptions of Associations:Intact  Orientation:Full (Time, Place and Person)  Thought Content:Logical; Paranoid Ideation; Delusions    Hallucinations:Auditory  Ideas of Reference:Delusions; Paranoia  Suicidal Thoughts:No  Homicidal Thoughts:No   Sensorium  Memory:Immediate Fair; Recent Fair; Remote Fair  Judgment:Intact  Insight:Present   Executive Functions  Concentration:Fair  Attention Span:Fair  Recall:Fair  Fund of Knowledge:Fair  Language:Fair   Psychomotor Activity  Psychomotor Activity:Normal   Assets  Assets:Communication Skills; Desire for Improvement; Financial Resources/Insurance; Housing; Intimacy; Leisure Time; Physical Health; Social Support; Talents/Skills; Transportation   Sleep  Sleep:Poor   Physical Exam: Physical Exam Cardiovascular:     Rate and Rhythm: Normal rate.  Pulmonary:     Effort:  Pulmonary effort is normal.  Musculoskeletal:     Cervical back: Normal range of motion.  Neurological:     Mental Status: He is alert and oriented to person, place, and time.    Review of Systems  Constitutional: Negative.   HENT: Negative.    Eyes: Negative.   Respiratory: Negative.    Cardiovascular: Negative.   Gastrointestinal: Negative.   Genitourinary: Negative.   Musculoskeletal: Negative.   Neurological: Negative.   Endo/Heme/Allergies: Negative.   Psychiatric/Behavioral:  Positive for hallucinations.    Blood pressure (!) 140/89, pulse 97, temperature 97.8 F (36.6 C), temperature source Oral, resp. rate 18, SpO2 100%. There is no height or weight on file to calculate BMI.  Musculoskeletal: Strength & Muscle Tone: within normal limits Gait & Station: normal Patient leans: N/A   [facility] MSE Discharge Disposition for Follow up and Recommendations: Based on my evaluation I certify that psychiatric inpatient services furnished can reasonably be expected to improve the patient's condition which I recommend transfer to an appropriate accepting facility.   Patient is recommended for inpatient psychiatric treatment for psychosis. Patient is voluntary. Patient is accepted to [facility] today.   Medications  Start zyprexa 5 mg po at bedtime for psychosis  Labs Lab  Orders         CBC with Differential/Platelet         Comprehensive metabolic panel         Hemoglobin A1c         Lipid panel         TSH         RPR         Hepatitis panel, acute         HIV Antibody (routine testing w rflx)         POCT Urine Drug Screen - (I-Screen)    EKG  Ani Deoliveira L, NP 11/23/2023, 1:35 PM

## 2023-11-23 NOTE — Progress Notes (Signed)
Pt has been accepted to [facility] on 11/23/2023 Bed assignment: 403-1  Pt meets inpatient criteria per: Liborio Nixon NP  Attending Physician will be: Dr. Sarita Bottom, MD   Report can be called to: Adult unit: [phone]  Pt can arrive after CONSENT /DC  Care Team Notified: [facility]  Malva Limes RN, Liborio Nixon NP, Earl Gala LPN. Roddie Mc RN April Coleman RN  [place] Machelle Raybon LCSW-A   11/23/2023 2:04 PM

## 2023-11-23 NOTE — ED Notes (Signed)
Pt is calm and cooperative no pain or distress noted pt denies AH@this  time he denies SI/HI pt states he can contract for safety will continue to monitor for safety

## 2023-11-23 NOTE — ED Notes (Addendum)
Pt was given to Garnet RN@[facility]  ADULT UNIT

## 2023-11-23 NOTE — Progress Notes (Signed)
   11/23/23 1225  [facility] Triage Screening (Walk-ins at [facility] only)  What Is the Reason for Your Visit/Call Today? Gregg Crawford presents to [facility] voluntarily accompanied by a stepdad. Pt states that he is hearing voices nostop. Pt denies SI, HI, VH and alcohol/drug use at this time. Pt endorses AH staing that he can make out what they are say & right now they are telling him he shouldn't be here discussing this.  How Long Has This Been Causing You Problems? > than 6 months  Have You Recently Had Any Thoughts About Hurting Yourself? No  Are You Planning to Commit Suicide/Harm Yourself At This time? No  Have you Recently Had Thoughts About Hurting Someone [name]? No  Are You Planning To Harm Someone At This Time? No  Physical Abuse Denies  Verbal Abuse Denies  Sexual Abuse Denies  Exploitation of patient/patient's resources Denies  Self-Neglect Denies  Are you currently experiencing any auditory, visual or other hallucinations? Yes  Please explain the hallucinations you are currently experiencing: Auditory - pt states that he can make out what they are say & right now they are telling him he shouldn't be here discussing this  Have You Used Any Alcohol or Drugs in the Past 24 Hours? No  Do you have any current medical co-morbidities that require immediate attention? No  Clinician description of patient physical appearance/behavior: neatly dressed, calm, cooperative  What Do You Feel Would Help You the Most Today? Social Support;Treatment for Depression or other mood problem;Medication(s)  If access to [facility] Urgent Care was not available, would you have sought care in the Emergency Department? No  Determination of Need Urgent (48 hours)  Options For Referral Medication Management;Inpatient Hospitalization;Intensive Outpatient Therapy;Outpatient Therapy  Determination of Need filed? Yes

## 2023-11-23 NOTE — Discharge Instructions (Addendum)
Patient accepted to [facility].

## 2023-11-23 NOTE — BH Assessment (Signed)
Comprehensive Clinical Assessment (CCA) Note  11/23/2023 [name] [id number]  DISPOSITION: Per Liborio Nixon NP pt is recommended for inpatient psychiatric treatment  The patient demonstrates the following risk factors for suicide: Chronic risk factors for suicide include: psychiatric disorder of psychoisis and substance use disorder. Acute risk factors for suicide include: social withdrawal/isolation and loss (financial, interpersonal, professional). Protective factors for this patient include: positive social support, positive therapeutic relationship, responsibility to others (children, family), and hope for the future. Considering these factors, the overall suicide risk at this point appears to be low. Patient is appropriate for outpatient follow up.   Per Triage assessment: "Gregg Crawford presents to [facility] voluntarily accompanied by a stepdad. Pt states that he is hearing voices nostop. Pt denies SI, HI, VH and alcohol/drug use at this time. Pt endorses AH staing that he can make out what they are say & right now they are telling him he shouldn't be here discussing this.  With further assessment: Pt is a 28 yo male who presented voluntarily reporting symptoms of psychosis that have been worsening over the last 3-4 months. Pt stated that he has not slept for the last 3 days and his AVH are getting worse. Pt stated he sees snakes and fires burning. Pt stated he hears voices telling him someone is going to hurt him. Pt is somewhat paranoid as he has been afraid to leave his home although he stated he does. Per pt's father, pt was very confused yesterday. Pt sees [facility] for medication management but does not have an OP therapist. Pt denied SI, HI, self-harm and nay current substance use. Pt stated that he has been "clean" from methamphetamine for 1-2 years but, did report a short relapse 3-4 months ago. Pt has been psychiatrically hospitalized once in 2020 at [facility].   Pt lives  with his girlfriend and her 64 yo child. Pt also reported he has a 32 yo that lives elsewhere. 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    Chief Complaint:  Chief Complaint  Patient presents with   Hallucinations   Visit Diagnosis:  Psychosis Stimulant use d/o, Methamphetamine    CCA Screening, Triage and Referral (STR)  Patient Reported Information How did you hear about [place]? Self-referral What Is the Reason for Your Visit/Call Today? Gregg Crawford presents to [facility] voluntarily accompanied by a stepdad. Pt states that he is hearing voices nostop. Pt denies SI, HI, VH and alcohol/drug use at this time. Pt endorses AH staing that he can make out what they are say & right now they are telling him he shouldn't be here discussing this.  How Long Has This Been Causing You Problems? > than 6 months  What Do You Feel Would Help You the Most Today? Social Support; Treatment for Depression or other mood problem; Medication(s)   Have You Recently Had Any Thoughts About Hurting Yourself? No  Are You Planning to Commit Suicide/Harm Yourself At This time? No   Flowsheet Row ED from 11/23/2023 in [facility] ED from 04/21/2023 in [facility]gent Care at [facility] ED from 01/27/2023 in [facility] Urgent Care at [facility] RISK CATEGORY No Risk No Risk No Risk       Have you Recently Had Thoughts About Hurting Someone [name]? No  Are You Planning to Harm Someone  at This Time? No  Explanation: na  Have You Used Any Alcohol or Drugs in the Past 24 Hours? No  How Long Ago Did You Use Drugs or Alcohol? na What Did You Use and How Much? na  Do You Currently Have a Therapist/Psychiatrist? Yes  Name of Therapist/Psychiatrist: Name of Therapist/Psychiatrist: Pt stated he received medication management through [facility]. Pt stated he does not have an OP therapist.   Have You Been Recently Discharged From Any Office Practice or Programs? No  Explanation of Discharge From Practice/Program: na    CCA Screening Triage Referral Assessment Type of Contact: Face-to-Face  Telemedicine Service Delivery:   Is this Initial or Reassessment?   Date Telepsych consult ordered in [facility]:    Time Telepsych consult ordered in [facility]:    Location of Assessment: [facility] Assessment Services  Provider Location: [facility] Assessment Services   Collateral Involvement: NP spoke with father.   Does Patient Have a Automotive engineer Guardian? No  Legal Guardian Contact Information: na  Copy of Legal Guardianship Form: No - copy requested  Legal Guardian Notified of Arrival: -- (na)  Legal Guardian Notified of Pending Discharge: -- (na)  If Minor and Not Living with Parent(s), Who has Custody? adult  Is CPS involved or ever been involved? -- (none reported)  Is APS involved or ever been involved? -- (none reported)   Patient Determined To Be At Risk for Harm To Self or Others Based on Review of Patient Reported Information or Presenting Complaint? No  Method: No Plan  Availability of Means: No access or NA  Intent: Vague intent or NA  Notification Required: -- (na)  Additional Information for Danger to Others Potential: Active psychosis  Additional Comments for Danger to Others Potential: na  Are There Guns or Other Weapons in Your Home? Yes  Types of Guns/Weapons: Secondary school teacher Secured?                            Yes  Who Could Verify You Are Able To Have These Secured: father  Do You Have any Outstanding Charges, Pending Court Dates, Parole/Probation? denied  Contacted To Inform of Risk of Harm To Self or Others: -- (na)    Does Patient Present under Involuntary Commitment? No    [place] of Residence: [place]   Patient Currently Receiving the Following  Services: Medication Management   Determination of Need: Emergent (2 hours) (Per Liborio Nixon NP pt is recommended for inpatient psychiatric treatment)   Options For Referral: Inpatient Hospitalization     CCA Biopsychosocial Patient Reported Schizophrenia/Schizoaffective Diagnosis in Past: No   Strengths: able to ask for and accept help   Mental Health Symptoms Depression:  Difficulty Concentrating   Duration of Depressive symptoms: Duration of Depressive Symptoms: Greater than two weeks   Mania:  Racing thoughts   Anxiety:   Restlessness   Psychosis:  Delusions; Hallucinations   Duration of Psychotic symptoms: Duration of Psychotic Symptoms: Less than six months   Trauma:  None   Obsessions:  None   Compulsions:  None   Inattention:  None   Hyperactivity/Impulsivity:  None   Oppositional/Defiant Behaviors:  None   Emotional Irregularity:  None (none reported)   Other Mood/Personality Symptoms:  none    Mental Status Exam Appearance and self-care  Stature:  Average   Weight:  Overweight   Clothing:  Casual   Grooming:  Normal   Cosmetic use:  None   Posture/gait:  Normal   Motor activity:  Not Remarkable   Sensorium  Attention:  Normal   Concentration:  Anxiety interferes   Orientation:  X5   Recall/memory:  Normal   Affect and Mood  Affect:  Anxious   Mood:  Dysphoric   Relating  Eye contact:  Normal   Facial expression:  Anxious   Attitude toward examiner:  Cooperative   Thought and Language  Speech flow: Clear and Coherent   Thought content:  Appropriate to Mood and Circumstances   Preoccupation:  None   Hallucinations:  Auditory; Visual   Organization:  Intact; Loose   Company secretary of Knowledge:  Average   Intelligence:  Average   Abstraction:  Functional   Judgement:  Impaired   Reality Testing:  Distorted   Insight:  Lacking; Flashes of insight   Decision Making:  Vacilates   Social  Functioning  Social Maturity:  Responsible   Social Judgement:  Normal   Stress  Stressors:  Other (Comment) (AVH and paranoia)   Coping Ability:  Exhausted; Overwhelmed   Skill Deficits:  Self-care; Decision making   Supports:  Family; Friends/Service system     Religion: Religion/Spirituality Are You A Religious Person?: No How Might This Affect Treatment?: unknown  Leisure/Recreation: Leisure / Recreation Do You Have Hobbies?: No  Exercise/Diet: Exercise/Diet Do You Exercise?: No Have You Gained or Lost A Significant Amount of Weight in the Past Six Months?: No Do You Follow a Special Diet?: No Do You Have Any Trouble Sleeping?: Yes Explanation of Sleeping Difficulties: Pt stated that he has not slept in 3 days.   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?: No  Education: Education Is Patient Currently Attending School?: No Last Grade Completed: 12 Did You Attend College?: No Did You Have An Individualized Education Program (IIEP): No Did You Have Any Difficulty At [employer]?: No Patient's Education Has Been Impacted by Current Illness: No   CCA Family/Childhood History Family and Relationship History: Family history Marital status: Long term relationship Long term relationship, how long?: unknown What types of issues is patient dealing with in the relationship?: unknown Additional relationship information: none Does patient have children?: Yes How many children?: 1 How is patient's relationship with their children?: Patient reports having a close relationship with his son.  Childhood History:  Childhood  History By whom was/is the patient raised?: Both parents Did patient suffer any verbal/emotional/physical/sexual abuse as a child?: No Has patient ever been sexually abused/assaulted/raped as an adolescent or adult?: No Witnessed domestic violence?: No Has patient been affected by domestic violence as an adult?: No       CCA Substance Use Alcohol/Drug Use: Alcohol / Drug Use Pain Medications: see MAR Prescriptions: see MAR Over the Counter: see MAR History of alcohol / drug use?: Yes Longest period of sobriety (when/how long): Patient stated that he has been "clean" of meth for 1-2 years despite a short relapse about 3-4 months ago. Negative Consequences of Use:  (none reported) Withdrawal Symptoms:  (none reported) Substance #1 Name of Substance 1: Hx of methamphetamine use 1 - Age of First Use: unknown 1 - Amount (size/oz): unknown 1 -  Frequency: unknown 1 - Duration: stopped 3-4 months go per pt 1 - Last Use / Amount: 3-4 months ago per pt 1 - Method of Aquiring: unknown 1- Route of Use: unknown                       ASAM's:  Six Dimensions of Multidimensional Assessment  Dimension 1:  Acute Intoxication and/or Withdrawal Potential:   Dimension 1:  Description of individual's past and current experiences of substance use and withdrawal: none reported  Dimension 2:  Biomedical Conditions and Complications:   Dimension 2:  Description of patient's biomedical conditions and  complications: none reported  Dimension 3:  Emotional, Behavioral, or Cognitive Conditions and Complications:  Dimension 3:  Description of emotional, behavioral, or cognitive conditions and complications: psychotic symptoms  Dimension 4:  Readiness to Change:     Dimension 5:  Relapse, Continued use, or Continued Problem Potential:     Dimension 6:  Recovery/Living Environment:     ASAM Severity Score: ASAM's Severity Rating Score: 9  ASAM Recommended Level of Treatment: ASAM Recommended  Level of Treatment: Level II Partial Hospitalization Treatment   Substance use Disorder (SUD) Substance Use Disorder (SUD)  Checklist Symptoms of Substance Use: Continued use despite having a persistent/recurrent physical/psychological problem caused/exacerbated by use, Continued use despite persistent or recurrent social, interpersonal problems, caused or exacerbated by use, Recurrent use that results in a failure to fulfill major role obligations (work, school, home)  Recommendations for Services/Supports/Treatments: Recommendations for Services/Supports/Treatments Recommendations For Services/Supports/Treatments: CD-IOP Intensive Chemical Dependency Program  Disposition Recommendation per psychiatric provider: We recommend inpatient psychiatric hospitalization when medically cleared. Patient is under voluntary admission status at this time; please IVC if attempts to leave hospital.   DSM5 Diagnoses: Patient Active Problem List   Diagnosis Date Noted   Umbilical hernia 07/11/2017   Physical exam 12/09/2015   Smokeless tobacco use 12/01/2011     Referrals to Alternative Service(s): Referred to Alternative Service(s):   Place:   Date:   Time:    Referred to Alternative Service(s):   Place:   Date:   Time:    Referred to Alternative Service(s):   Place:   Date:   Time:    Referred to Alternative Service(s):   Place:   Date:   Time:     Gregg Crawford T, Counselor

## 2023-11-24 ENCOUNTER — Encounter (HOSPITAL_COMMUNITY): Payer: Self-pay | Admitting: Behavioral Health

## 2023-11-24 ENCOUNTER — Encounter (HOSPITAL_COMMUNITY): Payer: Self-pay

## 2023-11-24 DIAGNOSIS — F203 Undifferentiated schizophrenia: Secondary | ICD-10-CM

## 2023-11-24 DIAGNOSIS — F1520 Other stimulant dependence, uncomplicated: Secondary | ICD-10-CM | POA: Insufficient documentation

## 2023-11-24 DIAGNOSIS — F209 Schizophrenia, unspecified: Principal | ICD-10-CM | POA: Diagnosis present

## 2023-11-24 LAB — RPR: RPR Ser Ql: NONREACTIVE

## 2023-11-24 MED ORDER — DIPHENHYDRAMINE HCL 25 MG PO CAPS: 50.0000 mg | ORAL_CAPSULE | Freq: Three times a day (TID) | ORAL | Status: DC | PRN

## 2023-11-24 MED ORDER — HALOPERIDOL LACTATE 5 MG/ML IJ SOLN: 5.0000 mg | Freq: Three times a day (TID) | INTRAMUSCULAR | Status: DC | PRN

## 2023-11-24 MED ORDER — BUSPIRONE HCL 15 MG PO TABS
15.0000 mg | ORAL_TABLET | Freq: Three times a day (TID) | ORAL | Status: DC
Start: 2023-11-24 — End: 2023-11-28
  Administered 2023-11-24 – 2023-11-28 (×11): 15 mg via ORAL
  Filled 2023-11-24 (×14): qty 1

## 2023-11-24 MED ORDER — ACETAMINOPHEN 325 MG PO TABS
650.0000 mg | ORAL_TABLET | Freq: Four times a day (QID) | ORAL | Status: DC | PRN
  Administered 2023-11-26: 650 mg via ORAL
  Filled 2023-11-24: qty 2

## 2023-11-24 MED ORDER — DIPHENHYDRAMINE HCL 50 MG/ML IJ SOLN: 50.0000 mg | Freq: Three times a day (TID) | INTRAMUSCULAR | Status: DC | PRN

## 2023-11-24 MED ORDER — HYDROXYZINE HCL 25 MG PO TABS
25.0000 mg | ORAL_TABLET | Freq: Three times a day (TID) | ORAL | Status: DC | PRN
  Administered 2023-11-27: 25 mg via ORAL
  Filled 2023-11-24: qty 10
  Filled 2023-11-24: qty 1

## 2023-11-24 MED ORDER — BUPROPION HCL ER (XL) 300 MG PO TB24
300.0000 mg | ORAL_TABLET | Freq: Every morning | ORAL | Status: DC
  Administered 2023-11-24 – 2023-11-28 (×5): 300 mg via ORAL
  Filled 2023-11-24 (×7): qty 1

## 2023-11-24 MED ORDER — BUSPIRONE HCL 15 MG PO TABS
15.0000 mg | ORAL_TABLET | Freq: Every day | ORAL | Status: DC
  Administered 2023-11-24: 15 mg via ORAL
  Filled 2023-11-24 (×4): qty 1

## 2023-11-24 MED ORDER — MAGNESIUM HYDROXIDE 400 MG/5ML PO SUSP
30.0000 mL | Freq: Every day | ORAL | Status: DC | PRN
Start: 2023-11-24 — End: 2023-11-28
  Administered 2023-11-26: 30 mL via ORAL
  Filled 2023-11-24: qty 30

## 2023-11-24 MED ORDER — LORAZEPAM 2 MG/ML IJ SOLN: 2.0000 mg | Freq: Three times a day (TID) | INTRAMUSCULAR | Status: DC | PRN

## 2023-11-24 MED ORDER — HALOPERIDOL LACTATE 5 MG/ML IJ SOLN: 10.0000 mg | Freq: Three times a day (TID) | INTRAMUSCULAR | Status: DC | PRN

## 2023-11-24 MED ORDER — ARIPIPRAZOLE 10 MG PO TABS
10.0000 mg | ORAL_TABLET | Freq: Every day | ORAL | Status: DC
  Administered 2023-11-25 – 2023-11-28 (×4): 10 mg via ORAL
  Filled 2023-11-24 (×5): qty 1

## 2023-11-24 MED ORDER — HALOPERIDOL 5 MG PO TABS: 5.0000 mg | ORAL_TABLET | Freq: Three times a day (TID) | ORAL | Status: DC | PRN

## 2023-11-24 MED ORDER — ALUM & MAG HYDROXIDE-SIMETH 200-200-20 MG/5ML PO SUSP: 30.0000 mL | ORAL | Status: DC | PRN

## 2023-11-24 MED ORDER — ARIPIPRAZOLE 5 MG PO TABS
5.0000 mg | ORAL_TABLET | Freq: Every day | ORAL | Status: DC
  Administered 2023-11-24: 5 mg via ORAL
  Filled 2023-11-24 (×4): qty 1

## 2023-11-24 MED ORDER — ATOMOXETINE HCL 40 MG PO CAPS
40.0000 mg | ORAL_CAPSULE | Freq: Every day | ORAL | Status: DC
  Administered 2023-11-24 – 2023-11-28 (×5): 40 mg via ORAL
  Filled 2023-11-24 (×8): qty 1

## 2023-11-24 MED ORDER — OLANZAPINE 5 MG PO TBDP
5.0000 mg | ORAL_TABLET | Freq: Every day | ORAL | Status: DC
  Filled 2023-11-24: qty 1

## 2023-11-24 NOTE — H&P (Signed)
Psychiatric Admission Assessment Adult  Patient Identification: Gregg Crawford  MRN:  [id number]  Date of Evaluation:  11/24/2023  Chief Complaint: Worsening auditory/visual hallucinations of 8 months that worsened within the last 3 months.  Principal Diagnosis: Schizophrenia (HCC)  Diagnosis:  Principal Problem:   Schizophrenia (HCC) Active Problems:   Moderate stimulant use disorder (HCC)  History of Present Illness: This is an admission evaluation for this 28 year old Caucasian male with prior hx of schizophrenia. Admitted to the [facility] from the [facility] with complaint of worsening auditory/visual hallucinations of 8 months that worsened in the last 3 months. After evaluation at the [facility], patient was transferred to the [facility] for further psychiatric evaluation/treatments. A review of his current lab results has shown his UDS positive for methamphetamine & THC. During this evaluation, he reports,   "I had gone to the [facility] yesterday with my step-father. I have been hearing voices that started 8 months ago. However, in the last 3 months, it has been going non-stop. The voices I hear are familiar voices of people I know. The other voices, I'm unable to recognize. I feel like my thoughts are being broad-casted. I feel like I'm in a different reality than everyone else. In 2020, I was in this hospital for the same complaint. That was the first time I have ever heard voices. When I left this hospital in 2020, I took my discharge medicines for 3 months, then stopped because it killed my sex drive & it felt like my personality was shut-off. Two years ago, I saw a mental health provider online from [facility] for depression & anxiety. They started me on Buspar, Wellbutrin, Strattera & Seroquel. The Seroquel I took at night, the other medicines I took during the day. I would say the day medicines helped, but the Seroquel usually will knock me out. I did not take the Seroquel regularly because of the drowsiness. The  last time I took these medicines was yesterday. I do snort methamphetamine sparingly. 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 I have been staying sober from the meth, however, I will slip-off from time to time to use. The last time I used methamphetamine was 6 days ago. I'm doing good today, but the voices are still going. I also see stuff. The voices will tell me sometimes that there is someone coming to hurt me or to hurt myself. Sometimes I will see someone I know coming to hurt me, but this person I was seeing is actually not there. I have never attempted suicide & there no one in my family who has ever attempted suicide. I started using meth at age 84 & weed use is usually occasional. I purposely would not take Seroquel sometimes because I do not want to fall asleep & something comes & hurt me".  Objective: Gregg Crawford presents alert, oriented & aware of situation. He presents as a good historian. Patient recommended to not have a room-mate due to his AVH/paranoid ideations.  Associated Signs/Symptoms:  Depression Symptoms:  depressed mood, anhedonia, anxiety,  (Hypo) Manic Symptoms:   Patient denies any hypomanic symptoms.  Anxiety Symptoms:  Excessive Worry,  Psychotic Symptoms:  Hallucinations: Auditory Visual  PTSD Symptoms: NA  Total Time spent with patient: 1.5 hours  Past Psychiatric History: Patient was admitted in this [facility] in 2020, received treatment for schizophrenia. Has hx of & current use of methamphetamine.  Is the patient at risk to self?  No.  Has the patient been a risk to self in the past 6 months? No.  Has the patient been a risk to self within the distant past? No.  Is the patient a risk to others? No.  Has the patient been a risk to others in the past 6 months? No.  Has the patient been a risk to others within the distant past? No.   [place] Scale:  Flowsheet Row Ad[facility] (Current) [facility] Most recent reading at 11/24/2023 12:12 AM ED from 11/23/2023 in [facility] Most recent reading at 11/23/2023  4:40 PM ED from 04/21/2023 in [facility] Most recent reading at 04/21/2023  1:15 PM  C-SSRS RISK CATEGORY No Risk No Risk No Risk      Prior Inpatient Therapy: Yes.   If yes, describe: [facility] in 2020.   Prior Outpatient Therapy: Yes.   If yes, describe: [facility], [facility] outpatient.   Alcohol Screening: 1. How often do you have a drink containing alcohol?: Never 2. How many drinks containing alcohol do you have on a typical day when you are drinking?: 1 or 2 3. How often do you have six or more drinks on one occasion?: Never AUDIT-C Score: 0 9. Have you or someone else been injured as a result of your drinking?: No 10. Has a relative or friend or a doctor or another health worker been concerned about your drinking or suggested you cut down?: No Alcohol Use Disorder Identification Test Final Score (AUDIT): 0  Substance Abuse History in the last 12 months:  Yes.    Consequences of Substance Abuse: Discussed with patient during this admission evaluation. Medical Consequences:  Liver damage, Possible death by overdose Legal Consequences:  Arrests, jail time, Loss of driving privilege. Family Consequences:  Family discord, divorce and or separation.  Previous Psychotropic Medications: Yes   Psychological Evaluations: Yes   Past Medical History:  Past Medical History:  Diagnosis Date   Head injury 05/2010   reacurrent sxs dizziness, headaches,confusion had a negative MRI and EEG   Psychiatric complaint    Viral encephalitis     Past Surgical History:  Procedure Laterality Date   FRACTURE SURGERY     fracture bilateral ankles   Family History:  Family History  Problem Relation Age of Onset   Hypertension Father    Diabetes Maternal Grandfather    Early death Maternal  Grandfather    Hypertension Maternal Grandfather    Heart disease Maternal Grandfather    Melanoma Mother    Family Psychiatric  History: Patient reports, "Every male on the maternal side of my family has mental illness.  Tobacco Screening:  Social History   Tobacco Use  Smoking Status Never  Smokeless Tobacco Current   Types: Chew  Tobacco Comments   dips    BH Tobacco Counseling     Are you interested in Tobacco Cessation Medications?  N/A, patient does not use tobacco products Counseled patient on smoking cessation:  N/A, patient does not use tobacco products Reason Tobacco Screening Not Completed: No value filed.       Social History:  Social History   Substance and Sexual Activity  Alcohol Use Yes   Alcohol/week: 10.0 standard drinks of alcohol   Types: 10 Cans of beer per week   Comment: was drinking 1-2 beers daily, last drink was 10/13/18     Social History   Substance and Sexual Activity  Drug Use Yes   Frequency: 1.0 times per week   Types: Marijuana, Methamphetamines   Comment: once in last 4-5 days; before that 3 months ago; before that 2 years ago    Additional Social History:  Allergies:  No Known Allergies  Lab Results:  Results for orders placed or performed during the hospital encounter of 11/23/23 (from the past 48 hours)  POCT Urine Drug Screen - (I-Screen)     Status: Abnormal   Collection Time: 11/23/23  2:50 PM  Result Value Ref Range   POC Amphetamine UR None Detected NONE DETECTED (Cut Off Level 1000 ng/mL)   POC Secobarbital (BAR) None Detected NONE DETECTED (Cut Off Level 300 ng/mL)   POC Buprenorphine (BUP) None Detected NONE DETECTED (Cut Off Level 10 ng/mL)   POC Oxazepam (BZO) None Detected NONE DETECTED (Cut Off Level 300 ng/mL)   POC Cocaine UR None Detected NONE DETECTED (Cut Off Level 300 ng/mL)   POC Methamphetamine UR Positive (A) NONE DETECTED (Cut Off Level 1000 ng/mL)   POC Morphine None Detected NONE DETECTED (Cut Off  Level 300 ng/mL)   POC Methadone UR None Detected NONE DETECTED (Cut Off Level 300 ng/mL)   POC Oxycodone UR None Detected NONE DETECTED (Cut Off Level 100 ng/mL)   POC Marijuana UR Positive (A) NONE DETECTED (Cut Off Level 50 ng/mL)  Hemoglobin A1c     Status: None   Collection Time: 11/23/23  3:53 PM  Result Value Ref Range   Hgb A1c MFr Bld 5.1 4.8 - 5.6 %    Comment: (NOTE) Pre diabetes:          5.7%-6.4%  Diabetes:              >6.4%  Glycemic control for   <7.0% adults with diabetes    Mean Plasma Glucose 99.67 mg/dL    Comment: Performed at [facility], [address], [place], [place] [address]  CBC with Differential/Platelet     Status: None   Collection Time: 11/23/23  3:55 PM  Result Value Ref Range   WBC 5.2 4.0 - 10.5 K/uL   RBC 4.96 4.22 - 5.81 MIL/uL   Hemoglobin 14.8 13.0 - 17.0 g/dL   HCT 60.4 54.0 - 98.1 %   MCV 88.7 80.0 - 100.0 fL   MCH 29.8 26.0 - 34.0 pg   MCHC 33.6 30.0 - 36.0 g/dL   RDW 19.1 47.8 - 29.5 %   Platelets 256 150 - 400 K/uL   nRBC 0.0 0.0 - 0.2 %   Neutrophils Relative % 43 %   Neutro Abs 2.2 1.7 - 7.7 K/uL   Lymphocytes Relative 43 %   Lymphs Abs 2.3 0.7 - 4.0 K/uL   Monocytes Relative 11 %   Monocytes Absolute 0.6 0.1 - 1.0 K/uL   Eosinophils Relative 1 %   Eosinophils Absolute 0.1 0.0 - 0.5 K/uL   Basophils Relative 2 %   Basophils Absolute 0.1 0.0 - 0.1 K/uL   Immature Granulocytes 0 %   Abs Immature Granulocytes 0.02 0.00 - 0.07 K/uL    Comment: Performed at [facility], [address], [place], [place] [address]  Comprehensive metabolic panel     Status: None   Collection Time: 11/23/23  3:55 PM  Result Value Ref Range   Sodium 139 135 - 145 mmol/L   Potassium 4.1 3.5 - 5.1 mmol/L   Chloride 104 98 - 111 mmol/L   CO2 29 22 - 32 mmol/L  Glucose, Bld 86 70 - 99 mg/dL    Comment: Glucose reference range applies only to samples taken after fasting for at least 8 hours.   BUN 10 6 - 20 mg/dL   Creatinine, Ser  1.61 0.61 - 1.24 mg/dL   Calcium 9.3 8.9 - 09.6 mg/dL   Total Protein 6.8 6.5 - 8.1 g/dL   Albumin 3.8 3.5 - 5.0 g/dL   AST 25 15 - 41 U/L   ALT 36 0 - 44 U/L   Alkaline Phosphatase 81 38 - 126 U/L   Total Bilirubin 0.4 0.0 - 1.2 mg/dL   GFR, Estimated >04 >54 mL/min    Comment: (NOTE) Calculated using the CKD-EPI Creatinine Equation (2021)    Anion gap 6 5 - 15    Comment: Performed at [facility], [address], [place], [place] [address]  RPR     Status: None   Collection Time: 11/23/23  3:55 PM  Result Value Ref Range   RPR Ser Ql NON REACTIVE NON REACTIVE    Comment: Performed at [facility], [address] [place], [place] [address]  Hepatitis panel, acute     Status: None   Collection Time: 11/23/23  3:55 PM  Result Value Ref Range   Hepatitis B Surface Ag NON REACTIVE NON REACTIVE   HCV Ab NON REACTIVE NON REACTIVE    Comment: (NOTE) Nonreactive HCV antibody screen is consistent with no HCV infections,  unless recent infection is suspected or other evidence exists to indicate HCV infection.     Hep A IgM NON REACTIVE NON REACTIVE   Hep B C IgM NON REACTIVE NON REACTIVE    Comment: Performed at [facility], [address] [place], [place] [address]  HIV Antibody (routine testing w rflx)     Status: None   Collection Time: 11/23/23  3:55 PM  Result Value Ref Range   HIV Screen 4th Generation wRfx Non Reactive Non Reactive    Comment: Performed at [facility], [address] [place], [place] [address]  Lipid panel     Status: None   Collection Time: 11/23/23  3:55 PM  Result Value Ref Range   Cholesterol 166 0 - 200 mg/dL   Triglycerides 89 <578 mg/dL   HDL 52 >46 mg/dL   Total CHOL/HDL Ratio 3.2 RATIO   VLDL 18 0 - 40 mg/dL   LDL Cholesterol 96 0 - 99 mg/dL    Comment:        Total Cholesterol/HDL:CHD Risk Coronary Heart Disease Risk Table                     Men   Women  1/2 Average Risk   3.4   3.3  Average Risk       5.0    4.4  2 X Average Risk   9.6   7.1  3 X Average Risk  23.4   11.0        Use the calculated Patient Ratio above and the CHD Risk Table to determine the patient's CHD Risk.        ATP III CLASSIFICATION (LDL):  <100     mg/dL   Optimal  962-952  mg/dL   Near or Above                    Optimal  130-159  mg/dL   Borderline  841-324  mg/dL   High  >  190     mg/dL   Very High Performed at [facility], [address] [place], [place] [address]   TSH     Status: None   Collection Time: 11/23/23  3:55 PM  Result Value Ref Range   TSH 1.451 0.350 - 4.500 uIU/mL    Comment: Performed by a 3rd Generation assay with a functional sensitivity of <=0.01 uIU/mL. Performed at [facility], [address] [place], [place] [address]    Blood Alcohol level:  Lab Results  Component Value Date   ETH <10 11/10/2018   Metabolic Disorder Labs:  Lab Results  Component Value Date   HGBA1C 5.1 11/23/2023   MPG 99.67 11/23/2023   MPG 99.67 11/10/2018   Lab Results  Component Value Date   PROLACTIN 25.6 (H) 11/10/2018   Lab Results  Component Value Date   CHOL 166 11/23/2023   TRIG 89 11/23/2023   HDL 52 11/23/2023   CHOLHDL 3.2 11/23/2023   VLDL 18 11/23/2023   LDLCALC 96 11/23/2023   LDLCALC 94 02/19/2020   Current Medications: Current Facility-Administered Medications  Medication Dose Route Frequency Provider Last Rate Last Admin   acetaminophen (TYLENOL) tablet 650 mg  650 mg Oral Q6H PRN White, Patrice L, NP       alum & mag hydroxide-simeth (MAALOX/MYLANTA) 200-200-20 MG/5ML suspension 30 mL  30 mL Oral Q4H PRN White, Patrice L, NP       [START ON 11/25/2023] ARIPiprazole (ABILIFY) tablet 10 mg  10 mg Oral Daily Yael Angerer I, NP       atomoxetine (STRATTERA) capsule 40 mg  40 mg Oral Daily Armandina Stammer I, NP   40 mg at 11/24/23 1304   buPROPion (WELLBUTRIN XL) 24 hr tablet 300 mg  300 mg Oral q morning Cristianna Cyr I, NP   300 mg at 11/24/23 1304   busPIRone (BUSPAR)  tablet 15 mg  15 mg Oral Daily Armandina Stammer I, NP   15 mg at 11/24/23 1304   haloperidol (HALDOL) tablet 5 mg  5 mg Oral TID PRN White, Patrice L, NP       And   diphenhydrAMINE (BENADRYL) capsule 50 mg  50 mg Oral TID PRN White, Patrice L, NP       haloperidol lactate (HALDOL) injection 5 mg  5 mg Intramuscular TID PRN White, Patrice L, NP       And   diphenhydrAMINE (BENADRYL) injection 50 mg  50 mg Intramuscular TID PRN White, Patrice L, NP       And   LORazepam (ATIVAN) injection 2 mg  2 mg Intramuscular TID PRN White, Patrice L, NP       haloperidol lactate (HALDOL) injection 10 mg  10 mg Intramuscular TID PRN White, Patrice L, NP       And   diphenhydrAMINE (BENADRYL) injection 50 mg  50 mg Intramuscular TID PRN White, Patrice L, NP       And   LORazepam (ATIVAN) injection 2 mg  2 mg Intramuscular TID PRN White, Patrice L, NP       hydrOXYzine (ATARAX) tablet 25 mg  25 mg Oral TID PRN White, Patrice L, NP       magnesium hydroxide (MILK OF MAGNESIA) suspension 30 mL  30 mL Oral Daily PRN White, Patrice L, NP       PTA Medications: Medications Prior to Admission  Medication Sig Dispense Refill Last Dose/Taking   atomoxetine (STRATTERA) 40 MG capsule Take 40  mg by mouth daily.      buPROPion (WELLBUTRIN XL) 300 MG 24 hr tablet Take 300 mg by mouth every morning.      busPIRone (BUSPAR) 15 MG tablet Take 15 mg by mouth daily.      Musculoskeletal: Strength & Muscle Tone: within normal limits Gait & Station: normal Patient leans: N/A  Psychiatric Specialty Exam:  Presentation  General Appearance:  Appropriate for Environment; Casual; Fairly Groomed  Eye Contact: Good  Speech: Clear and Coherent; Normal Rate  Speech Volume: Normal  Handedness: Right   Mood and Affect  Mood: Depressed; Anxious  Affect: Congruent   Thought Process  Thought Processes: Coherent; Goal Directed; Linear  Duration of Psychotic Symptoms: Greater than 2 weeks.  Past Diagnosis of  Schizophrenia or Psychoactive disorder: Yes  Descriptions of Associations:Intact  Orientation:Full (Time, Place and Person)  Thought Content:Logical  Hallucinations:Hallucinations: Auditory Description of Auditory Hallucinations: "The voices will tell me to hurt myself sometime".  Ideas of Reference:Paranoia  Suicidal Thoughts:Suicidal Thoughts: No  Homicidal Thoughts:Homicidal Thoughts: No  Sensorium  Memory: Immediate Good; Recent Good; Remote Good  Judgment: Fair  Insight: Good   Executive Functions  Concentration: Good  Attention Span: Good  Recall: Good  Fund of Knowledge: Fair  Language: Good  Psychomotor Activity  Psychomotor Activity: Psychomotor Activity: Normal  Assets  Assets: Communication Skills; Desire for Improvement; Financial Resources/Insurance; Housing; Physical Health; Resilience; Social Support  Sleep  Sleep: Sleep: Fair Number of Hours of Sleep: 6  Physical Exam: Physical Exam Vitals and nursing note reviewed.  Cardiovascular:     Pulses: Normal pulses.     Comments: The diastolic blood pressure is elevated (115/93). Staff will recheck. Patient is in no apparent distress. Musculoskeletal:     Cervical back: Normal range of motion.  Neurological:     Mental Status: He is alert.    ROS Blood pressure (!) 115/93, pulse 83, temperature 97.8 F (36.6 C), temperature source Oral, resp. rate 12, height 5\' 10"  (1.778 m), weight 96.2 kg, SpO2 100%. Body mass index is 30.42 kg/m.  Treatment Plan Summary: Daily contact with patient to assess and evaluate symptoms and progress in treatment and Medication management.   Principal/active diagnoses:  Schizophrenia.  Stimulant use disorder.  Associated symptoms.  Auditory hallucinations.  Worsening paranoia.  Methamphetamine abuse.  Plan: The risks/benefits/side-effects/alternatives to the medications in use were discussed in detail with the patient and time was given for  patient's questions. The patient consents to medication trial.   -Initiated Abilify 5 mg po today (11-24-23).  -Increased Abilify to 10 mg po daily (starting tomorrow). The plan is to transition patient to the monthly injectable by discharge if he tolerates the oral dose. -Continue Hydroxyzine 25 mg po tid prn for anxiety. -Resumed on:  -Strattera 40 mg po daily for attention/concentration.  -Wellbutrin XL 150 mg po daily for depression.  -Increased Buspar 15 mg to tid for anxiety.   Agitation protocols. --Haldol 5 mg, oral, 3 times daily as needed, mild agitation --Benadryl 50 mg, oral, 3 times daily as needed, mild agitation                                     OR --Haldol injection 5 mg, IM, 3 times daily as needed, moderate agitation --Benadryl injection 50 mg, IM, 3 times daily as needed, moderate agitation --Ativan injection 2 mg, IM, 3 times daily as needed, moderate agitation  OR --Haldol injection 10 mg, IM, 3 times daily as needed, severe agitation --Benadryl injection 50 mg, IM, 3 times daily as needed, severe agitation --Ativan injection 2 mg, IM, 3 times daily as needed, severe agitation  Other PRNS -Continue Tylenol 650 mg every 6 hours PRN for mild pain -Continue Maalox 30 ml Q 4 hrs PRN for indigestion -Continue MOM 30 ml po Q 6 hrs for constipation  Safety and Monitoring: Voluntary admission to inpatient psychiatric unit for safety, stabilization and treatment Daily contact with patient to assess and evaluate symptoms and progress in treatment Patient's case to be discussed in multi-disciplinary team meeting Observation Level : q15 minute checks Vital signs: q12 hours Precautions: Safety  Discharge Planning: Social work and case management to assist with discharge planning and identification of hospital follow-up needs prior to discharge Estimated LOS: 5-7 days Discharge Concerns: Need to establish a safety plan; Medication  compliance and effectiveness Discharge Goals: Return home with outpatient referrals for mental health follow-up including medication management/psychotherapy  Observation Level/Precautions:  15 minute checks  Laboratory:   Per Ed, current lab results reviewed  Psychotherapy: Enrolled in the group sessions.   Medications:  See MAR.  Consultations:  As needed.   Discharge Concerns: Safety, mood stability.  Estimated LOS: 3-5 days  Other: NA   Physician Treatment Plan for Primary Diagnosis: Schizophrenia (HCC)  Long Term Goal(s): Improvement in symptoms so as ready for discharge  Short Term Goals: Ability to identify changes in lifestyle to reduce recurrence of condition will improve, Ability to verbalize feelings will improve, Ability to disclose and discuss suicidal ideas, and Ability to demonstrate self-control will improve  Physician Treatment Plan for Secondary Diagnosis: Principal Problem:   Schizophrenia (HCC) Active Problems:   Moderate stimulant use disorder (HCC)  Long Term Goal(s): Improvement in symptoms so as ready for discharge  Short Term Goals: Ability to identify and develop effective coping behaviors will improve, Ability to maintain clinical measurements within normal limits will improve, Compliance with prescribed medications will improve, and Ability to identify triggers associated with substance abuse/mental health issues will improve  I certify that inpatient services furnished can reasonably be expected to improve the patient's condition.    Armandina Stammer, NP, pmhnp, [id number]. 1/24/20252:56 PM

## 2023-11-24 NOTE — Progress Notes (Addendum)
Patient ID: Gregg Crawford, male   DOB: April 02, 1996, 28 y.o.   MRN: [id number]  D: Pt here voluntarily from [facility]. Pt denies SI/HI and pain at this time. Pt endorses an increase in AVH. States that the AVH have been increasing over the past year but have been progressively worse over the last 3 months. Does not identify a cause. His grandfather died 3 months ago but pt doesn't believe that has anything to do with his current symptoms. Pt states he got clean from methamphetamine in the past 2 years but has used a "small amount" (according to pt) in the last 4-5 days and then used the same amount 3 months prior to that. UDS positive for methamphetamine and marijuana. Denies tobacco and alcohol use. Pt lives with his girlfriend and his 67 year old son. 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 Endorses access to weapons but says they are not a problem as he is not suicidal. Pt has had problems sleeping (no sleep last 3 days) due to voices telling him that someone is coming to kill him. Says these are voices of people he knows and that sometimes they say things that mimic the the people he knows. "I was having a hard time telling reality because they would talk and say things these people would say and they would gain my trust. Then they would say nasty things about me and break me down." Pt is constantly wringing hands during the assessment process. Pt states that he has experienced issues with broadcasting thoughts over the last 3 months. "This is something new. I feel like I'll have a thought that I don't want people to know and it is being broadcast to the world. It seems people are acting like they can actually hear my thoughts. It could be my mind playing tricks but I think they can hear my thoughts."  Pt states the last time he took his prescribed meds was yesterday: Strattera 40 mg, Wellbutrin 300 mg, Buspar 15 mg. Anxiety 4/10 and depression 5/10. Pt states he is both afraid to sleep and feels he cannot sleep  due to the increased hallucinations. Was not visually hallucinating during assessment but says that the visual hallucinations are sudden and so real that the setting he is in actually changes in front of him.  Pt endorses social support from his girlfriend and his parents. Says he was coping before but is having trouble with that now. Was afraid to leave the house but is venturing out more because he needs to work and support his family.  A: Pt was offered support and encouragement. Pt is cooperative during assessment. VS assessed and admission paperwork signed. Belongings searched and contraband items placed in locker. Non-invasive skin search completed: tattoos on bilateral arms and lower left leg; old scars to left leg. Pt offered food and drink and both accepted. Pt introduced to unit milieu by nursing staff. Q 15 minute checks were started for safety.   R: Pt in dayroom eating. Pt safety maintained on unit.

## 2023-11-24 NOTE — Progress Notes (Signed)
Pt visible in the milieu.  Interacting appropriately with staff and peers.  Pt denied SI, HI, VH.  Positive for AH.  The voices are not command in nature.  Pt reports they say a lot of different things.  He does note improvement in AH today.  Safety checks continue for patient safety.  Pt safe on unit.

## 2023-11-24 NOTE — Group Note (Signed)
Date:  11/24/2023 Time:  4:44 PM  Group Topic/Focus:  Social Wellness (Self and Interpersonal)    Participation Level:  Active  Participation Quality:  Appropriate  Affect:  Appropriate  Cognitive:  Appropriate  Insight: Appropriate  Engagement in Group:  Engaged  Modes of Intervention:  Activity, Problem-solving, and Socialization  Additional Comments:   Pt attended and actively participated in the Social Wellness group. Pt practiced teamwork, used problem solving, communication, and critical thinking skills to complete the social activity.  Gregg Crawford 11/24/2023, 4:44 PM

## 2023-11-24 NOTE — Progress Notes (Signed)
   11/24/23 0015  Psych Admission Type (Psych Patients Only)  Admission Status Voluntary  Psychosocial Assessment  Patient Complaints Other (Comment);Anxiety;Depression (constant AVH)  Eye Contact Brief  Facial Expression Anxious  Affect Anxious  Speech Logical/coherent;Rapid  Interaction Guarded  Motor Activity Other (Comment) (wnl)  Appearance/Hygiene Unremarkable  Behavior Characteristics Cooperative;Anxious  Mood Depressed;Anxious;Suspicious  Thought Process  Coherency Disorganized  Content Paranoia;Preoccupation  Delusions Paranoid  Perception Hallucinations;Derealization  Hallucination Auditory  Judgment Impaired  Confusion None  Danger to Self  Current suicidal ideation? Denies  Danger to Others  Danger to Others None reported or observed

## 2023-11-24 NOTE — BH IP Treatment Plan (Addendum)
Interdisciplinary Treatment and Diagnostic Plan Update  11/24/2023 Time of Session: 10:40 AM Gregg Crawford MRN: [id number]  Principal Diagnosis: Schizophrenia [facility])  Secondary Diagnoses: Principal Problem:   Schizophrenia (HCC)   Current Medications:  Current Facility-Administered Medications  Medication Dose Route Frequency Provider Last Rate Last Admin   acetaminophen (TYLENOL) tablet 650 mg  650 mg Oral Q6H PRN White, Patrice L, NP       alum & mag hydroxide-simeth (MAALOX/MYLANTA) 200-200-20 MG/5ML suspension 30 mL  30 mL Oral Q4H PRN White, Patrice L, NP       [START ON 11/25/2023] ARIPiprazole (ABILIFY) tablet 10 mg  10 mg Oral Daily Nwoko, Agnes I, NP       ARIPiprazole (ABILIFY) tablet 5 mg  5 mg Oral Daily Nwoko, Agnes I, NP       atomoxetine (STRATTERA) capsule 40 mg  40 mg Oral Daily Nwoko, Agnes I, NP       buPROPion (WELLBUTRIN XL) 24 hr tablet 300 mg  300 mg Oral q morning Nwoko, Agnes I, NP       busPIRone (BUSPAR) tablet 15 mg  15 mg Oral Daily Nwoko, Agnes I, NP       haloperidol (HALDOL) tablet 5 mg  5 mg Oral TID PRN White, Patrice L, NP       And   diphenhydrAMINE (BENADRYL) capsule 50 mg  50 mg Oral TID PRN White, Patrice L, NP       haloperidol lactate (HALDOL) injection 5 mg  5 mg Intramuscular TID PRN White, Patrice L, NP       And   diphenhydrAMINE (BENADRYL) injection 50 mg  50 mg Intramuscular TID PRN White, Patrice L, NP       And   LORazepam (ATIVAN) injection 2 mg  2 mg Intramuscular TID PRN White, Patrice L, NP       haloperidol lactate (HALDOL) injection 10 mg  10 mg Intramuscular TID PRN White, Patrice L, NP       And   diphenhydrAMINE (BENADRYL) injection 50 mg  50 mg Intramuscular TID PRN White, Patrice L, NP       And   LORazepam (ATIVAN) injection 2 mg  2 mg Intramuscular TID PRN White, Patrice L, NP       hydrOXYzine (ATARAX) tablet 25 mg  25 mg Oral TID PRN White, Patrice L, NP       magnesium hydroxide (MILK OF MAGNESIA) suspension 30 mL   30 mL Oral Daily PRN White, Patrice L, NP       PTA Medications: Medications Prior to Admission  Medication Sig Dispense Refill Last Dose/Taking   atomoxetine (STRATTERA) 40 MG capsule Take 40 mg by mouth daily.      buPROPion (WELLBUTRIN XL) 300 MG 24 hr tablet Take 300 mg by mouth every morning.      busPIRone (BUSPAR) 15 MG tablet Take 15 mg by mouth daily.       Patient Stressors: Other: constant AVH    Patient Strengths: Average or above average intelligence  Capable of independent living  Motivation for treatment/growth  Physical Health  Supportive family/friends   Treatment Modalities: Medication Management, Group therapy, Case management,  1 to 1 session with clinician, Psychoeducation, Recreational therapy.   Physician Treatment Plan for Primary Diagnosis: Schizophrenia (HCC) Long Term Goal(s): Improvement in symptoms so as ready for discharge   Short Term Goals: Ability to identify and develop effective coping behaviors will improve Ability to maintain clinical measurements within normal limits will  improve Compliance with prescribed medications will improve Ability to identify triggers associated with substance abuse/mental health issues will improve Ability to identify changes in lifestyle to reduce recurrence of condition will improve Ability to verbalize feelings will improve Ability to disclose and discuss suicidal ideas Ability to demonstrate self-control will improve  Medication Management: Evaluate patient's response, side effects, and tolerance of medication regimen.  Therapeutic Interventions: 1 to 1 sessions, Unit Group sessions and Medication administration.  Evaluation of Outcomes: Not Progressing  Physician Treatment Plan for Secondary Diagnosis: Principal Problem:   Schizophrenia (HCC)  Long Term Goal(s): Improvement in symptoms so as ready for discharge   Short Term Goals: Ability to identify and develop effective coping behaviors will  improve Ability to maintain clinical measurements within normal limits will improve Compliance with prescribed medications will improve Ability to identify triggers associated with substance abuse/mental health issues will improve Ability to identify changes in lifestyle to reduce recurrence of condition will improve Ability to verbalize feelings will improve Ability to disclose and discuss suicidal ideas Ability to demonstrate self-control will improve     Medication Management: Evaluate patient's response, side effects, and tolerance of medication regimen.  Therapeutic Interventions: 1 to 1 sessions, Unit Group sessions and Medication administration.  Evaluation of Outcomes: Not Progressing   RN Treatment Plan for Primary Diagnosis: Schizophrenia (HCC) Long Term Goal(s): Knowledge of disease and therapeutic regimen to maintain health will improve  Short Term Goals: Ability to remain free from injury will improve, Ability to verbalize frustration and anger appropriately will improve, Ability to demonstrate self-control, Ability to participate in decision making will improve, Ability to verbalize feelings will improve, Ability to disclose and discuss suicidal ideas, Ability to identify and develop effective coping behaviors will improve, and Compliance with prescribed medications will improve  Medication Management: RN will administer medications as ordered by provider, will assess and evaluate patient's response and provide education to patient for prescribed medication. RN will report any adverse and/or side effects to prescribing provider.  Therapeutic Interventions: 1 on 1 counseling sessions, Psychoeducation, Medication administration, Evaluate responses to treatment, Monitor vital signs and CBGs as ordered, Perform/monitor CIWA, COWS, AIMS and Fall Risk screenings as ordered, Perform wound care treatments as ordered.  Evaluation of Outcomes: Not Progressing   LCSW Treatment Plan for  Primary Diagnosis: Schizophrenia (HCC) Long Term Goal(s): Safe transition to appropriate next level of care at discharge, Engage patient in therapeutic group addressing interpersonal concerns.  Short Term Goals: Engage patient in aftercare planning with referrals and resources, Increase social support, Increase ability to appropriately verbalize feelings, Increase emotional regulation, Facilitate acceptance of mental health diagnosis and concerns, Facilitate patient progression through stages of change regarding substance use diagnoses and concerns, Identify triggers associated with mental health/substance abuse issues, and Increase skills for wellness and recovery  Therapeutic Interventions: Assess for all discharge needs, 1 to 1 time with Social worker, Explore available resources and support systems, Assess for adequacy in community support network, Educate family and significant other(s) on suicide prevention, Complete Psychosocial Assessment, Interpersonal group therapy.  Evaluation of Outcomes: Not Progressing   Progress in Treatment: Attending groups: Yes. Participating in groups: Yes. Taking medication as prescribed:  patient was just admitted to the hospital Toleration medication: patient was just admitted to the hospital Family/Significant other contact made:  No, will contact:  girlfriend, Radene Gunning, [phone] Patient understands diagnosis: Yes Discussing patient identified problems/goals with staff: Yes Medical problems stabilized or resolved:Yes Denies suicidal/homicidal ideation: Yes. Issues/concerns per patient self-inventory: No.  New  problem(s) identified:  No  New Short Term/Long Term Goal(s):     medication stabilization, elimination of SI thoughts, development of comprehensive mental wellness plan.   Patient Goals:  "get medication to quiet the voices in my head.  They've gotten worse in the past 3 months."    Discharge Plan or Barriers:  Patient recently  admitted. CSW will continue to follow and assess for appropriate referrals and possible discharge planning.   Reason for Continuation of Hospitalization: Hallucinations, Medication management   Estimated Length of Stay:  5 - 7 days  Last 3 [place] Suicide Severity Risk Score: Flowsheet Row [facility] (Current[facility] Most recent reading at 11/24/2023 12:12 AM ED from 11/23/2023 in [facility] Most recent reading at 11/23/2023  4:40 PM ED from 04/21/2023 in [facility] Most recent reading at 04/21/2023  1:15 PM  C-SSRS RISK CATEGORY No Risk No Risk No Risk       Last PHQ 2/9 Scores:    02/19/2020    1:35 PM 09/07/2018    2:55 PM 05/09/2018    1:03 PM  Depression screen PHQ 2/9  Decreased Interest 0 0 0  Down, Depressed, Hopeless 0 0 0  PHQ - 2 Score 0 0 0  Altered sleeping 0 0 0  Tired, decreased energy 0 0 0  Change in appetite 0 0 0  Feeling bad or failure about yourself  0 0 0  Trouble concentrating 0 0 0  Moving slowly or fidgety/restless 0 0 0  Suicidal thoughts 0 0 0  PHQ-9 Score 0 0 0  Difficult doing work/chores  Not difficult at all Not difficult at all    Scribe for Treatment Team: Alla Feeling, LCSWA 11/24/2023 1:02 PM

## 2023-11-24 NOTE — Group Note (Signed)
Date:  11/24/2023 Time:  9:32 PM  Group Topic/Focus:  Wrap-Up Group:   The focus of this group is to help patients review their daily goal of treatment and discuss progress on daily workbooks.    Participation Level:  Did Not Attend  11/24/2023, 9:32 PM

## 2023-11-24 NOTE — Plan of Care (Signed)
  Problem: Education: Goal: Knowledge of [facility] Education information/materials will improve Outcome: Progressing Goal: Emotional status will improve Outcome: Progressing Goal: Mental status will improve Outcome: Progressing Goal: Verbalization of understanding the information provided will improve Outcome: Progressing   Problem: Activity: Goal: Interest or engagement in activities will improve Outcome: Progressing Goal: Sleeping patterns will improve Outcome: Progressing   Problem: Coping: Goal: Ability to verbalize frustrations and anger appropriately will improve Outcome: Progressing Goal: Ability to demonstrate self-control will improve Outcome: Progressing   Problem: Health Behavior/Discharge Planning: Goal: Identification of resources available to assist in meeting health care needs will improve Outcome: Progressing Goal: Compliance with treatment plan for underlying cause of condition will improve Outcome: Progressing   Problem: Physical Regulation: Goal: Ability to maintain clinical measurements within normal limits will improve Outcome: Progressing   Problem: Safety: Goal: Periods of time without injury will increase Outcome: Progressing   Problem: Activity: Goal: Will verbalize the importance of balancing activity with adequate rest periods Outcome: Progressing   Problem: Education: Goal: Will be free of psychotic symptoms Outcome: Progressing Goal: Knowledge of the prescribed therapeutic regimen will improve Outcome: Progressing   Problem: Coping: Goal: Coping ability will improve Outcome: Progressing Goal: Will verbalize feelings Outcome: Progressing   Problem: Health Behavior/Discharge Planning: Goal: Compliance with prescribed medication regimen will improve Outcome: Progressing   Problem: Nutritional: Goal: Ability to achieve adequate nutritional intake will improve Outcome: Progressing   Problem: Role Relationship: Goal:  Ability to communicate needs accurately will improve Outcome: Progressing Goal: Ability to interact with others will improve Outcome: Progressing   Problem: Safety: Goal: Ability to redirect hostility and anger into socially appropriate behaviors will improve Outcome: Progressing Goal: Ability to remain free from injury will improve Outcome: Progressing   Problem: Self-Care: Goal: Ability to participate in self-care as condition permits will improve Outcome: Progressing   Problem: Self-Concept: Goal: Will verbalize positive feelings about self Outcome: Progressing   Problem: Education: Goal: Ability to state activities that reduce stress will improve Outcome: Progressing   Problem: Coping: Goal: Ability to identify and develop effective coping behavior will improve Outcome: Progressing   Problem: Self-Concept: Goal: Ability to identify factors that promote anxiety will improve Outcome: Progressing Goal: Level of anxiety will decrease Outcome: Progressing Goal: Ability to modify response to factors that promote anxiety will improve Outcome: Progressing   Problem: Education: Goal: Utilization of techniques to improve thought processes will improve Outcome: Progressing Goal: Knowledge of the prescribed therapeutic regimen will improve Outcome: Progressing   Problem: Activity: Goal: Interest or engagement in leisure activities will improve Outcome: Progressing Goal: Imbalance in normal sleep/wake cycle will improve Outcome: Progressing   Problem: Coping: Goal: Coping ability will improve Outcome: Progressing Goal: Will verbalize feelings Outcome: Progressing   Problem: Health Behavior/Discharge Planning: Goal: Ability to make decisions will improve Outcome: Progressing Goal: Compliance with therapeutic regimen will improve Outcome: Progressing   Problem: Role Relationship: Goal: Will demonstrate positive changes in social behaviors and relationships Outcome:  Progressing   Problem: Safety: Goal: Ability to disclose and discuss suicidal ideas will improve Outcome: Progressing Goal: Ability to identify and utilize support systems that promote safety will improve Outcome: Progressing   Problem: Self-Concept: Goal: Will verbalize positive feelings about self Outcome: Progressing Goal: Level of anxiety will decrease Outcome: Progressing

## 2023-11-24 NOTE — Group Note (Signed)
Date:  11/24/2023 Time:  11:46 AM  Group Topic/Focus:  Goals Group:   The focus of this group is to help patients establish daily goals to achieve during treatment and discuss how the patient can incorporate goal setting into their daily lives to aide in recovery. Orientation:   The focus of this group is to educate the patient on the purpose and policies of crisis stabilization and provide a format to answer questions about their admission.  The group details unit policies and expectations of patients while admitted.    Participation Level:  Active  Participation Quality:  Appropriate  Affect:  Appropriate  Cognitive:  Appropriate  Insight: Appropriate  Engagement in Group:  Engaged  Modes of Intervention:  Discussion, Orientation, and Rapport Building  Additional Comments:   Pt attended and actively participated in the Orientation and Goals group. Pt was calm, cooperative and attentive throughout the group session. Pt personal goal for today is to become more acclimated to treatment and program structure.  Gregg Crawford 11/24/2023, 11:46 AM

## 2023-11-24 NOTE — [facility] Suicide Risk Assessment (Signed)
Suicide Risk Assessment  Admission Assessment    [facility] Admission Suicide Risk Assessment   Nursing information obtained from:  Patient  Demographic factors:  Male, Caucasian, Access to firearms  Current Mental Status:  NA  Loss Factors:  Loss of significant relationship (grandfathe died 3 months ago)  Historical Factors:  Impulsivity  Risk Reduction Factors:  Positive social support, Positive therapeutic relationship, Positive coping skills or problem solving skills, Living with another person, especially a relative, Responsible for children under 34 years of age  Total Time spent with patient: 1.5 hours  Principal Problem: Schizophrenia (HCC)  Diagnosis:  Principal Problem:   Schizophrenia (HCC)  Subjective Data: See H&P.  Continued Clinical Symptoms:  Alcohol Use Disorder Identification Test Final Score (AUDIT): 0 The "Alcohol Use Disorders Identification Test", Guidelines for Use in Primary Care, Second Edition.  World Science writer [facility]). Score between 0-7:  no or low risk or alcohol related problems. Score between 8-15:  moderate risk of alcohol related problems. Score between 16-19:  high risk of alcohol related problems. Score 20 or above:  warrants further diagnostic evaluation for alcohol dependence and treatment.  CLINICAL FACTORS:   Alcohol/Substance Abuse/Dependencies Schizophrenia:   Paranoid or undifferentiated type More than one psychiatric diagnosis Currently Psychotic Previous Psychiatric Diagnoses and Treatments  Musculoskeletal: Strength & Muscle Tone: within normal limits Gait & Station: normal Patient leans: N/A  Psychiatric Specialty Exam:  Presentation  General Appearance:  Appropriate for Environment; Casual; Fairly Groomed  Eye Contact: Good  Speech: Clear and Coherent; Normal Rate  Speech Volume: Normal  Handedness: Right   Mood and Affect  Mood: Depressed; Anxious  Affect: Congruent   Thought Process  Thought  Processes: Coherent; Goal Directed; Linear  Descriptions of Associations:Intact  Orientation:Full (Time, Place and Person)  Thought Content:Logical  History of Schizophrenia/Schizoaffective disorder:Yes  Duration of Psychotic Symptoms:Greater than six months  Hallucinations:Hallucinations: Auditory Description of Auditory Hallucinations: "The voices will tell me to hurt myself sometime".  Ideas of Reference:Paranoia  Suicidal Thoughts:Suicidal Thoughts: No  Homicidal Thoughts:Homicidal Thoughts: No   Sensorium  Memory: Immediate Good; Recent Good; Remote Good  Judgment: Fair  Insight: Good   Executive Functions  Concentration: Good  Attention Span: Good  Recall: Good  Fund of Knowledge: Fair  Language: Good   Psychomotor Activity  Psychomotor Activity: Psychomotor Activity: Normal   Assets  Assets: Communication Skills; Desire for Improvement; Financial Resources/Insurance; Housing; Physical Health; Resilience; Social Support  Sleep  Sleep: Sleep: Fair Number of Hours of Sleep: 6  Physical Exam: Blood pressure (!) 115/93, pulse 83, temperature 97.8 F (36.6 C), temperature source Oral, resp. rate 12, height 5\' 10"  (1.778 m), weight 96.2 kg, SpO2 100%. Body mass index is 30.42 kg/m.  COGNITIVE FEATURES THAT CONTRIBUTE TO RISK:  Closed-mindedness, Polarized thinking, and Thought constriction (tunnel vision)    SUICIDE RISK:   Severe:  Frequent, intense, and enduring suicidal ideation, specific plan, no subjective intent, but some objective markers of intent (i.e., choice of lethal method), the method is accessible, some limited preparatory behavior, evidence of impaired self-control, severe dysphoria/symptomatology, multiple risk factors present, and few if any protective factors, particularly a lack of social support.  PLAN OF CARE: See H&P  I certify that inpatient services furnished can reasonably be expected to improve the patient's  condition.   Armandina Stammer, NP, pmhnp, fnp-bc. 11/24/2023, 2:53 PM

## 2023-11-24 NOTE — Progress Notes (Signed)
   11/24/23 1400  Psych Admission Type (Psych Patients Only)  Admission Status Voluntary  Psychosocial Assessment  Patient Complaints Anxiety  Eye Contact Fair  Facial Expression Anxious  Affect Anxious  Speech Logical/coherent  Interaction Assertive  Motor Activity Other (Comment) (wnl)  Appearance/Hygiene Unremarkable  Behavior Characteristics Cooperative  Mood Anxious  Thought Process  Coherency Disorganized  Content Preoccupation;Paranoia  Delusions Paranoid  Perception Hallucinations  Hallucination Auditory  Judgment Impaired  Confusion None  Danger to Self  Current suicidal ideation? Denies  Danger to Others  Danger to Others None reported or observed   Pt endorses auditry hallucinations. Pt denies VH and SI/HI

## 2023-11-24 NOTE — [facility] Counselor (Signed)
Adult Comprehensive Assessment  Patient ID: Gregg Crawford, male   DOB: 11/11/1995, 28 y.o.   MRN: [id number]  Information Source: Information source: Patient  Current Stressors:  Patient states their primary concerns and needs for treatment are:: "People talking in my head, it's gotten a lot worse over the last month. 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 making me get behind on work" Family Relationships: "Causing a lot of stress with my fiance because the voices are telling me bad things about her" Financial / Lack of resources (include bankruptcy): "Yeah because my job is lacking" Housing / Lack of housing: None reported Physical health (include injuries & life threatening diseases): None reported Social relationships: None reported Substance abuse: None reported Bereavement / Loss: "I lost my grandpa 3 mo ago but not really stressful"  Living/Environment/Situation:  Living Arrangements: Spouse/significant other, Children Living conditions (as described by patient or guardian): House Who else lives in the home?: girlfriend, 5yo (lives with his mother) and 3yo How long has patient lived in current situation?: 2019 What is atmosphere in current home: Chaotic, Comfortable, Paramedic, Supportive  Family History:  Marital status: Single Long term relationship, how long?: Been with current girlfriend 1 yr 7 mo What types of issues is patient dealing with in the relationship?: "She is tired of this shit with the voices" Additional relationship information: None Are you sexually active?: Yes What is your sexual orientation?: Heterosexual  Has your sexual activity been affected by drugs,  alcohol, medication, or emotional stress?: No Does patient have children?: Yes How many children?: 2 How is patient's relationship with their children?: "it's really good"  Childhood History:  By whom was/is the patient raised?: Both parents Additional childhood history information: Patient reports his parents seperated when he was 48 years old. Patient also states that his father is an alcoholic  Description of patient's relationship with caregiver when they were a child: Patient reports having a good relationship with his parents during his childhood.  Patient's description of current relationship with people who raised him/her: Dad: no relationship, mom and step-dad: Very good relationship How were you disciplined when you got in trouble as a child/adolescent?: Whooped Does patient have siblings?: Yes Number of Siblings: 2 Description of patient's current relationship with siblings: Good relationship with sisters Did patient suffer any verbal/emotional/physical/sexual abuse as a child?: No Did patient suffer from severe childhood neglect?: No Has patient ever been sexually abused/assaulted/raped as an adolescent or adult?: No Was the patient ever a victim of a crime or a disaster?: No Witnessed domestic violence?: No Has patient been affected by domestic violence as an adult?: No  Education:  Highest grade of school patient has completed: 12 Currently a Consulting civil engineer?: No Learning disability?: No  Employment/Work Situation:   Employment Situation: Employed 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 If attempted suicide, did drugs/alcohol play a role in this?: No Alcohol/Substance Abuse Treatment Hx: Denies past history Has alcohol/substance abuse ever caused legal problems?: No  Social Support System:   Patient's Community Support System: Good Describe Community Support System: Mom and step-dad, girlfriend Type of faith/religion: [employer]ri[employer] Knuckles How does patient's faith help to cope with current illness?: NOne reported  Leisure/Recreation:   Do You Have Hobbies?: No  Strengths/Needs:   What is the patient's perception of their strengths?: None reported Patient states they can use these personal strengths during their treatment to contribute to their recovery: None reported Patient states these barriers may affect/interfere with their treatment: None reported Patient states these barriers may affect their return to the community: None reported  Discharge Plan:   Currently receiving community mental health services: No Patient states concerns and preferences for aftercare planning are: "Ill see a psychiatrist and therapist at discharge if itll help me" Patient states they will know when they are safe and ready for discharge when: "When the voices stop" Does patient have access to transportation?: Yes Does patient have financial barriers related to discharge medications?: No Patient description of barriers related to discharge medications: None reported Will patient be returning to same living situation after discharge?:  Yes  Summary/Recommendations:   Summary and Recommendations (to be completed by the evaluator): Gregg Crawford is a 28 year old male who is voluntarily admitted to [facility] from [facility] due to auditory hallucinations that are nonstop. Pt reports that he has been dealing with AH for "a while now" but recently they have gotten severe enough that he is falling behind at work and having constant arguments with his girlfriend. Pt reports the voices are of people he knows and they till him things they "could be true." Pt reports he has heard voices telling him about things that will happen to him or around him and they end up coming true. Pt reports it is hard to tell what is real and what is not. 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 Pt currently denying AVH, SI/HI. Pt requesting discharged several times during this assessment. Pt does not have outpatient providers for therapy and medication management but is open to them at discharge. While here, Gregg Crawford can benefit from crisis stabilization, medication management, therapeutic milieu, and referrals for services.   Kathi Der. 11/24/2023

## 2023-11-24 NOTE — Group Note (Signed)
Recreation Therapy Group Note   Group Topic:Team Building  Group Date: 11/24/2023 Start Time: 0930 End Time: 1000 Facilitators: Karsynn Deweese-McCall, LRT,CTRS Location: [address] Dayroom   Group Topic: Communication, Team Building, Problem Solving  Goal Area(s) Addresses:  Patient will effectively work with peer towards shared goal.  Patient will identify skills used to make activity successful.  Patient will share challenges and verbalize solution-driven approaches used. Patient will identify how skills used during activity can be used to reach post d/c goals.   Intervention: STEM Activity   Activity: [employer]. Patients were provided the following materials: 4 drinking straws, 5 rubber bands, 5 paper clips, 2 index cards, and 2 drinking cups. Using the provided materials patients were asked to build a launching mechanism to launch a ping pong ball across the room, approximately 10 feet. Patients were divided into teams of 3-5. Instructions required all materials be incorporated into the device, functionality of items left to the peer group's discretion.  Education: Pharmacist, community, Scientist, physiological, Air cabin crew, Building control surveyor.   Education Outcome: Acknowledges education/In group clarification offered/Needs additional education.    Affect/Mood: Appropriate   Participation Level: Engaged   Participation Quality: Independent   Behavior: Appropriate   Speech/Thought Process: Focused   Insight: Good   Judgement: Good   Modes of Intervention: Team-building   Patient Response to Interventions:  Engaged   Education Outcome:  In group clarification offered    Clinical Observations/Individualized Feedback: Pt was bright and engaged during activity. Pt went over strategies with peers and students on how to build a launcher. Pt listened to the ideas of others and offered ideas of his own. Pt was also attentive to what the student suggested as well. Pt successfully  completed the launcher with peers.   Plan: Continue to engage patient in RT group sessions 2-3x/week.   Gregg Crawford, LRT,CTRS 11/24/2023 11:48 AM

## 2023-11-24 NOTE — Plan of Care (Signed)
?  Problem: Health Behavior/Discharge Planning: ?Goal: Compliance with treatment plan for underlying cause of condition will improve ?Outcome: Progressing ?  ?

## 2023-11-24 NOTE — Tx Team (Signed)
Initial Treatment Plan 11/24/2023 12:29 AM [name] [id number]    PATIENT STRESSORS: Other: constant AVH     PATIENT STRENGTHS: Average or above average intelligence  Capable of independent living  Motivation for treatment/growth  Physical Health  Supportive family/friends    PATIENT IDENTIFIED PROBLEMS: Severe auditory and visual hallucinations  Paranoia  Anxiety  Depression  (Pt wants medication change; better management of anxiety and depression)             DISCHARGE CRITERIA:  Improved stabilization in mood, thinking, and/or behavior Motivation to continue treatment in a less acute level of care Verbal commitment to aftercare and medication compliance  PRELIMINARY DISCHARGE PLAN: Attend aftercare/continuing care group Outpatient therapy Return to previous living arrangement Return to previous work or school arrangements  PATIENT/FAMILY INVOLVEMENT: This treatment plan has been presented to and reviewed with the patient, Gregg Crawford, and/or family member.  The patient and family have been given the opportunity to ask questions and make suggestions.  Victorino December, RN 11/24/2023, 12:29 AM

## 2023-11-24 NOTE — [facility] Group Notes (Signed)
Adult Psychoeducational Group Note  Date:  11/24/2023 Time:  9:00 PM  Group Topic/Focus:  Wrap-Up Group:   The focus of this group is to help patients review their daily goal of treatment and discuss progress on daily workbooks.  Participation Level:  Active  Participation Quality:  Appropriate  Affect:  Appropriate  Cognitive:  Appropriate  Insight: Appropriate  Engagement in Group:  Engaged  Modes of Intervention:  Discussion  Additional Comments:  Nathin said his day 10. His goal was to get discharge and get out here.  Charna Busman Long 11/24/2023, 9:00 PM

## 2023-11-25 DIAGNOSIS — F209 Schizophrenia, unspecified: Secondary | ICD-10-CM | POA: Diagnosis not present

## 2023-11-25 MED ORDER — TRAZODONE HCL 50 MG PO TABS
50.0000 mg | ORAL_TABLET | Freq: Once | ORAL | Status: AC
  Administered 2023-11-25: 50 mg via ORAL
  Filled 2023-11-25 (×2): qty 1

## 2023-11-25 NOTE — Progress Notes (Signed)
   11/25/23 0558  15 Minute Checks  Location Bedroom  Visual Appearance Calm  Behavior Sleeping  Sleep (Behavioral Health Patients Only)  Calculate sleep? (Click Yes once per 24 hr at 0600 safety check) Yes  Documented sleep last 24 hours 9.25

## 2023-11-25 NOTE — Progress Notes (Signed)
[facility] MD Progress Note  11/25/2023 1:38 PM Gregg Crawford  MRN:  [id number] Subjective:   Gregg Crawford is a 28 yr old male who presented on 1/23 to [facility] with auditory hallucinations, he was admitted to [facility] on 1/24.  PPHx is significant for Schizophrenia and Meth Abuse and 1 Prior Psychiatric Hospitalization [facility] 10/2018).   Case was discussed in the multidisciplinary team. MAR was reviewed and patient was compliant with medications.  He received no PRN Medications yesterday.   Psychiatric Team made the following recommendations yesterday: -Start Abilify 5 mg daily -Restart Strattera 40 mg daily -Restart Wellbutrin XL 150 mg daily -Increased frequency of Buspar 15 mg to TID for anxiety     On interview today patient reports he slept good last night.  He reports his appetite is doing good.  He reports no SI, HI, or VH.  He reports continuing to have AH.  He reports still having Paranoia.  He reports no issues with his medications.  Discussed with him that he tolerated the starting dose of Abilify we would continue with the planned increase in the Abilify for today.  When asked he reports continuing to be interested in an LAI.  He reports feeling better.  He reports no other concerns at present.   Principal Problem: Schizophrenia (HCC) Diagnosis: Principal Problem:   Schizophrenia (HCC) Active Problems:   Moderate stimulant use disorder (HCC)  Total Time spent with patient:  I personally spent 35 minutes on the unit in direct patient care. The direct patient care time included face-to-face time with the patient, reviewing the patient's chart, communicating with other professionals, and coordinating care. Greater than 50% of this time was spent in counseling or coordinating care with the patient regarding goals of hospitalization, psycho-education, and discharge planning needs.   Past Psychiatric History: Schizophrenia and Meth Abuse and 1 Prior Psychiatric Hospitalization [facility]  10/2018).  Past Medical History:  Past Medical History:  Diagnosis Date   Head injury 05/2010   reacurrent sxs dizziness, headaches,confusion had a negative MRI and EEG   Psychiatric complaint    Viral encephalitis     Past Surgical History:  Procedure Laterality Date   FRACTURE SURGERY     fracture bilateral ankles   Family History:  Family History  Problem Relation Age of Onset   Hypertension Father    Diabetes Maternal Grandfather    Early death Maternal Grandfather    Hypertension Maternal Grandfather    Heart disease Maternal Grandfather    Melanoma Mother    Family Psychiatric  History:  Patient reports, "Every male on the maternal side of my family has mental illness.   Social History:  Social History   Substance and Sexual Activity  Alcohol Use Yes   Alcohol/week: 10.0 standard drinks of alcohol   Types: 10 Cans of beer per week   Comment: was drinking 1-2 beers daily, last drink was 10/13/18     Social History   Substance and Sexual Activity  Drug Use Yes   Frequency: 1.0 times per week   Types: Marijuana, Methamphetamines   Comment: once in last 4-5 days; before that 3 months ago; before that 2 years ago    Social History   Socioeconomic History   Marital status: Single    Spouse name: Not on file   Number of children: Not on file   Years of education: Not on file   Highest education level: Not on file  Occupational History   Occupation: Autobody work  Tobacco Use   Smoking status: Never   Smokeless tobacco: Current    Types: Chew   Tobacco comments:    dips  Vaping Use   Vaping status: Never Used  Substance and Sexual Activity   Alcohol use: Yes    Alcohol/week: 10.0 standard drinks of alcohol    Types: 10 Cans of beer per week    Comment: was drinking 1-2 beers daily, last drink was 10/13/18   Drug use: Yes    Frequency: 1.0 times per week    Types: Marijuana, Methamphetamines    Comment: once in last 4-5 days; before that 3 months ago;  before that 2 years ago   Sexual activity: Yes    Birth control/protection: Condom  Other Topics Concern   Not on file  Social History Narrative   2025 - Lives with girlfriends and 31 year old son            Lives at home with father; stays with mother and sister on occasion; Takes supplement (C-4) prior to workouts      Dr. Sharene Skeans for neuro   Dr. Beverely Low PCP at [facility]   Social Drivers of Health   Financial Resource Strain: Not on file  Food Insecurity: No Food Insecurity (11/24/2023)   Hunger Vital Sign    Worried About Running Out of Food in the Last Year: Never true    Ran Out of Food in the Last Year: Never true  Transportation Needs: No Transportation Needs (11/24/2023)   PRAPARE - Administrator, Civil Service (Medical): No    Lack of Transportation (Non-Medical): No  Physical Activity: Not on file  Stress: Not on file  Social Connections: Not on file   Additional Social History:                         Sleep: Good  Appetite:  Good  Current Medications: Current Facility-Administered Medications  Medication Dose Route Frequency Provider Last Rate Last Admin   acetaminophen (TYLENOL) tablet 650 mg  650 mg Oral Q6H PRN White, Patrice L, NP       alum & mag hydroxide-simeth (MAALOX/MYLANTA) 200-200-20 MG/5ML suspension 30 mL  30 mL Oral Q4H PRN White, Patrice L, NP       ARIPiprazole (ABILIFY) tablet 10 mg  10 mg Oral Daily Armandina Stammer I, NP   10 mg at 11/25/23 2956   atomoxetine (STRATTERA) capsule 40 mg  40 mg Oral Daily Armandina Stammer I, NP   40 mg at 11/25/23 0825   buPROPion (WELLBUTRIN XL) 24 hr tablet 300 mg  300 mg Oral q morning Armandina Stammer I, NP   300 mg at 11/25/23 2130   busPIRone (BUSPAR) tablet 15 mg  15 mg Oral TID Armandina Stammer I, NP   15 mg at 11/25/23 1204   haloperidol (HALDOL) tablet 5 mg  5 mg Oral TID PRN White, Patrice L, NP       And   diphenhydrAMINE (BENADRYL) capsule 50 mg  50 mg Oral TID PRN White, Patrice L, NP        haloperidol lactate (HALDOL) injection 5 mg  5 mg Intramuscular TID PRN White, Patrice L, NP       And   diphenhydrAMINE (BENADRYL) injection 50 mg  50 mg Intramuscular TID PRN White, Patrice L, NP       And   LORazepam (ATIVAN) injection 2 mg  2 mg Intramuscular TID PRN White, Patrice L,  NP       haloperidol lactate (HALDOL) injection 10 mg  10 mg Intramuscular TID PRN White, Patrice L, NP       And   diphenhydrAMINE (BENADRYL) injection 50 mg  50 mg Intramuscular TID PRN White, Patrice L, NP       And   LORazepam (ATIVAN) injection 2 mg  2 mg Intramuscular TID PRN White, Patrice L, NP       hydrOXYzine (ATARAX) tablet 25 mg  25 mg Oral TID PRN White, Patrice L, NP       magnesium hydroxide (MILK OF MAGNESIA) suspension 30 mL  30 mL Oral Daily PRN White, Patrice L, NP        Lab Results:  Results for orders placed or performed during the hospital encounter of 11/23/23 (from the past 48 hours)  POCT Urine Drug Screen - (I-Screen)     Status: Abnormal   Collection Time: 11/23/23  2:50 PM  Result Value Ref Range   POC Amphetamine UR None Detected NONE DETECTED (Cut Off Level 1000 ng/mL)   POC Secobarbital (BAR) None Detected NONE DETECTED (Cut Off Level 300 ng/mL)   POC Buprenorphine (BUP) None Detected NONE DETECTED (Cut Off Level 10 ng/mL)   POC Oxazepam (BZO) None Detected NONE DETECTED (Cut Off Level 300 ng/mL)   POC Cocaine UR None Detected NONE DETECTED (Cut Off Level 300 ng/mL)   POC Methamphetamine UR Positive (A) NONE DETECTED (Cut Off Level 1000 ng/mL)   POC Morphine None Detected NONE DETECTED (Cut Off Level 300 ng/mL)   POC Methadone UR None Detected NONE DETECTED (Cut Off Level 300 ng/mL)   POC Oxycodone UR None Detected NONE DETECTED (Cut Off Level 100 ng/mL)   POC Marijuana UR Positive (A) NONE DETECTED (Cut Off Level 50 ng/mL)  Hemoglobin A1c     Status: None   Collection Time: 11/23/23  3:53 PM  Result Value Ref Range   Hgb A1c MFr Bld 5.1 4.8 - 5.6 %    Comment:  (NOTE) Pre diabetes:          5.7%-6.4%  Diabetes:              >6.4%  Glycemic control for   <7.0% adults with diabetes    Mean Plasma Glucose 99.67 mg/dL    Comment: Performed at [facility], [address] [place], [place] [address]  CBC with Differential/Platelet     Status: None   Collection Time: 11/23/23  3:55 PM  Result Value Ref Range   WBC 5.2 4.0 - 10.5 K/uL   RBC 4.96 4.22 - 5.81 MIL/uL   Hemoglobin 14.8 13.0 - 17.0 g/dL   HCT 21.3 08.6 - 57.8 %   MCV 88.7 80.0 - 100.0 fL   MCH 29.8 26.0 - 34.0 pg   MCHC 33.6 30.0 - 36.0 g/dL   RDW 46.9 62.9 - 52.8 %   Platelets 256 150 - 400 K/uL   nRBC 0.0 0.0 - 0.2 %   Neutrophils Relative % 43 %   Neutro Abs 2.2 1.7 - 7.7 K/uL   Lymphocytes Relative 43 %   Lymphs Abs 2.3 0.7 - 4.0 K/uL   Monocytes Relative 11 %   Monocytes Absolute 0.6 0.1 - 1.0 K/uL   Eosinophils Relative 1 %   Eosinophils Absolute 0.1 0.0 - 0.5 K/uL   Basophils Relative 2 %   Basophils Absolute 0.1 0.0 - 0.1 K/uL   Immature Granulocytes 0 %   Abs Immature Granulocytes  0.02 0.00 - 0.07 K/uL    Comment: Performed at [facility], [address] [place], [place] [address]  Comprehensive metabolic panel     Status: None   Collection Time: 11/23/23  3:55 PM  Result Value Ref Range   Sodium 139 135 - 145 mmol/L   Potassium 4.1 3.5 - 5.1 mmol/L   Chloride 104 98 - 111 mmol/L   CO2 29 22 - 32 mmol/L   Glucose, Bld 86 70 - 99 mg/dL    Comment: Glucose reference range applies only to samples taken after fasting for at least 8 hours.   BUN 10 6 - 20 mg/dL   Creatinine, Ser 9.14 0.61 - 1.24 mg/dL   Calcium 9.3 8.9 - 78.2 mg/dL   Total Protein 6.8 6.5 - 8.1 g/dL   Albumin 3.8 3.5 - 5.0 g/dL   AST 25 15 - 41 U/L   ALT 36 0 - 44 U/L   Alkaline Phosphatase 81 38 - 126 U/L   Total Bilirubin 0.4 0.0 - 1.2 mg/dL   GFR, Estimated >95 >62 mL/min    Comment: (NOTE) Calculated using the CKD-EPI Creatinine Equation (2021)    Anion gap 6 5 - 15     Comment: Performed at [facility], [address] [place], [place] [address]  RPR     Status: None   Collection Time: 11/23/23  3:55 PM  Result Value Ref Range   RPR Ser Ql NON REACTIVE NON REACTIVE    Comment: Performed at [facility], [address], [place], [place] [address]  Hepatitis panel, acute     Status: None   Collection Time: 11/23/23  3:55 PM  Result Value Ref Range   Hepatitis B Surface Ag NON REACTIVE NON REACTIVE   HCV Ab NON REACTIVE NON REACTIVE    Comment: (NOTE) Nonreactive HCV antibody screen is consistent with no HCV infections,  unless recent infection is suspected or other evidence exists to indicate HCV infection.     Hep A IgM NON REACTIVE NON REACTIVE   Hep B C IgM NON REACTIVE NON REACTIVE    Comment: Performed at [facility], [address] [place], [place] [address]  HIV Antibody (routine testing w rflx)     Status: None   Collection Time: 11/23/23  3:55 PM  Result Value Ref Range   HIV Screen 4th Generation wRfx Non Reactive Non Reactive    Comment: Performed at [facility], [address] [place], [place] [address]  Lipid panel     Status: None   Collection Time: 11/23/23  3:55 PM  Result Value Ref Range   Cholesterol 166 0 - 200 mg/dL   Triglycerides 89 <244 mg/dL   HDL 52 >01 mg/dL   Total CHOL/HDL Ratio 3.2 RATIO   VLDL 18 0 - 40 mg/dL   LDL Cholesterol 96 0 - 99 mg/dL    Comment:        Total Cholesterol/HDL:CHD Risk Coronary Heart Disease Risk Table                     Men   Women  1/2 Average Risk   3.4   3.3  Average Risk       5.0   4.4  2 X Average Risk   9.6   7.1  3 X Average Risk  23.4   11.0        Use the calculated Patient  Ratio above and the CHD Risk Table to determine the patient's CHD Risk.        ATP III CLASSIFICATION (LDL):  <100     mg/dL   Optimal  098-119  mg/dL   Near or Above                    Optimal  130-159  mg/dL   Borderline  147-829  mg/dL   High  >562     mg/dL   Very  High Performed at [facility], [address], [place], [place] [address]   TSH     Status: None   Collection Time: 11/23/23  3:55 PM  Result Value Ref Range   TSH 1.451 0.350 - 4.500 uIU/mL    Comment: Performed by a 3rd Generation assay with a functional sensitivity of <=0.01 uIU/mL. Performed at [facility], [address], [place], [place] [address]     Blood Alcohol level:  Lab Results  Component Value Date   ETH <10 11/10/2018    Metabolic Disorder Labs: Lab Results  Component Value Date   HGBA1C 5.1 11/23/2023   MPG 99.67 11/23/2023   MPG 99.67 11/10/2018   Lab Results  Component Value Date   PROLACTIN 25.6 (H) 11/10/2018   Lab Results  Component Value Date   CHOL 166 11/23/2023   TRIG 89 11/23/2023   HDL 52 11/23/2023   CHOLHDL 3.2 11/23/2023   VLDL 18 11/23/2023   LDLCALC 96 11/23/2023   LDLCALC 94 02/19/2020    Physical Findings: AIMS:  , ,  ,  ,    CIWA:    COWS:     Musculoskeletal: Strength & Muscle Tone: within normal limits Gait & Station: normal Patient leans: N/A  Psychiatric Specialty Exam:  Presentation  General Appearance:  Appropriate for Environment; Casual  Eye Contact: Fair  Speech: Clear and Coherent; Normal Rate  Speech Volume: Normal  Handedness: Right   Mood and Affect  Mood: Dysphoric  Affect: Congruent (guarded)   Thought Process  Thought Processes: Coherent; Goal Directed  Descriptions of Associations:Intact  Orientation:Full (Time, Place and Person)  Thought Content:Logical; WDL  History of Schizophrenia/Schizoaffective disorder:Yes  Duration of Psychotic Symptoms:Greater than six months  Hallucinations:Hallucinations: Auditory Description of Auditory Hallucinations: "The voices will tell me to hurt myself sometime".  Ideas of Reference:Paranoia  Suicidal Thoughts:Suicidal Thoughts: No  Homicidal Thoughts:Homicidal Thoughts: No   Sensorium  Memory: Immediate Good;  Recent Good  Judgment: Fair  Insight: Good   Executive Functions  Concentration: Good  Attention Span: Good  Recall: Good  Fund of Knowledge: Fair  Language: Good   Psychomotor Activity  Psychomotor Activity: Psychomotor Activity: Normal   Assets  Assets: Communication Skills; Desire for Improvement; Financial Resources/Insurance; Physical Health; Resilience; Social Support   Sleep  Sleep: Sleep: Good Number of Hours of Sleep: 9.25    Physical Exam: Physical Exam Vitals and nursing note reviewed.  Constitutional:      General: He is not in acute distress.    Appearance: Normal appearance. He is normal weight. He is not ill-appearing or toxic-appearing.  HENT:     Head: Normocephalic and atraumatic.  Pulmonary:     Effort: Pulmonary effort is normal.  Musculoskeletal:        General: Normal range of motion.  Neurological:     General: No focal deficit present.     Mental Status: He is alert.    Review of Systems  Respiratory:  Negative for cough and shortness of breath.   Cardiovascular:  Negative for chest pain.  Gastrointestinal:  Negative for abdominal pain, constipation, diarrhea, nausea and vomiting.  Neurological:  Negative for dizziness, weakness and headaches.  Psychiatric/Behavioral:  Positive for depression and hallucinations. Negative for suicidal ideas. The patient is nervous/anxious.    Blood pressure (!) 117/90, pulse 86, temperature (!) 97.5 F (36.4 C), temperature source Oral, resp. rate 12, height 5\' 10"  (1.778 m), weight 96.2 kg, SpO2 98%. Body mass index is 30.42 kg/m.   Treatment Plan Summary: Daily contact with patient to assess and evaluate symptoms and progress in treatment and Medication management  Gregg Crawford is a 28 yr old male who presented on 1/23 to [facility] with auditory hallucinations, he was admitted to [facility] on 1/24.  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   Darrious has tolerated starting Abilify and so will continue with the planned increase today. He is minimizing his symptoms as he initially was upbeat stating he was feeling better and asking when he would be discharged but when told it would still be a few days he became sullen and no longer made eye contact.  We will continue to pursue an LAI given his history of medication non-compliance.  We will not make any other changes to his medications.  We will continue to monitor.   Schizophrenia: -Increase Abilify to 10 mg daily for psychosis  -Continue Wellbutrin XL 150 mg daily for depression -Continue Buspar 15 mg TID for anxiety -Continue Agitation Protocol: Haldol/Ativan/Benadryl   -Continue Strattera 40 mg daily for attention/concentration  -Continue PRN's: Tylenol, Maalox, Atarax, Milk of Magnesia   Lauro Franklin, MD 11/25/2023, 1:38 PM

## 2023-11-25 NOTE — Plan of Care (Signed)
  Problem: Education: Goal: Knowledge of [facility] Education information/materials will improve Outcome: Progressing Goal: Emotional status will improve Outcome: Progressing Goal: Mental status will improve Outcome: Progressing Goal: Verbalization of understanding the information provided will improve Outcome: Progressing   Problem: Activity: Goal: Interest or engagement in activities will improve Outcome: Progressing Goal: Sleeping patterns will improve Outcome: Progressing   Problem: Coping: Goal: Ability to verbalize frustrations and anger appropriately will improve Outcome: Progressing Goal: Ability to demonstrate self-control will improve Outcome: Progressing   Problem: Health Behavior/Discharge Planning: Goal: Identification of resources available to assist in meeting health care needs will improve Outcome: Progressing Goal: Compliance with treatment plan for underlying cause of condition will improve Outcome: Progressing   Problem: Physical Regulation: Goal: Ability to maintain clinical measurements within normal limits will improve Outcome: Progressing   Problem: Safety: Goal: Periods of time without injury will increase Outcome: Progressing   Problem: Activity: Goal: Will verbalize the importance of balancing activity with adequate rest periods Outcome: Progressing   Problem: Education: Goal: Will be free of psychotic symptoms Outcome: Progressing Goal: Knowledge of the prescribed therapeutic regimen will improve Outcome: Progressing   Problem: Coping: Goal: Coping ability will improve Outcome: Progressing Goal: Will verbalize feelings Outcome: Progressing   Problem: Health Behavior/Discharge Planning: Goal: Compliance with prescribed medication regimen will improve Outcome: Progressing   Problem: Nutritional: Goal: Ability to achieve adequate nutritional intake will improve Outcome: Progressing   Problem: Role Relationship: Goal:  Ability to communicate needs accurately will improve Outcome: Progressing Goal: Ability to interact with others will improve Outcome: Progressing   Problem: Safety: Goal: Ability to redirect hostility and anger into socially appropriate behaviors will improve Outcome: Progressing Goal: Ability to remain free from injury will improve Outcome: Progressing   Problem: Self-Care: Goal: Ability to participate in self-care as condition permits will improve Outcome: Progressing   Problem: Self-Concept: Goal: Will verbalize positive feelings about self Outcome: Progressing   Problem: Education: Goal: Ability to state activities that reduce stress will improve Outcome: Progressing   Problem: Coping: Goal: Ability to identify and develop effective coping behavior will improve Outcome: Progressing   Problem: Self-Concept: Goal: Ability to identify factors that promote anxiety will improve Outcome: Progressing Goal: Level of anxiety will decrease Outcome: Progressing Goal: Ability to modify response to factors that promote anxiety will improve Outcome: Progressing   Problem: Education: Goal: Utilization of techniques to improve thought processes will improve Outcome: Progressing Goal: Knowledge of the prescribed therapeutic regimen will improve Outcome: Progressing   Problem: Activity: Goal: Interest or engagement in leisure activities will improve Outcome: Progressing Goal: Imbalance in normal sleep/wake cycle will improve Outcome: Progressing   Problem: Coping: Goal: Coping ability will improve Outcome: Progressing Goal: Will verbalize feelings Outcome: Progressing   Problem: Health Behavior/Discharge Planning: Goal: Ability to make decisions will improve Outcome: Progressing Goal: Compliance with therapeutic regimen will improve Outcome: Progressing   Problem: Role Relationship: Goal: Will demonstrate positive changes in social behaviors and relationships Outcome:  Progressing   Problem: Safety: Goal: Ability to disclose and discuss suicidal ideas will improve Outcome: Progressing Goal: Ability to identify and utilize support systems that promote safety will improve Outcome: Progressing   Problem: Self-Concept: Goal: Will verbalize positive feelings about self Outcome: Progressing Goal: Level of anxiety will decrease Outcome: Progressing

## 2023-11-25 NOTE — Group Note (Signed)
Date:  11/25/2023 Time:  8:40 PM  Group Topic/Focus:  Wrap-Up Group:   The focus of this group is to help patients review their daily goal of treatment and discuss progress on daily workbooks.    Participation Level:  None  Participation Quality:  Attentive  Affect:  Appropriate  Cognitive:  Lacking  Insight: None  Engagement in Group:  Lacking  Modes of Intervention:  Discussion  Additional Comments:  Patient was present but did not participate   Gregg Crawford 11/25/2023, 8:40 PM

## 2023-11-25 NOTE — Group Note (Signed)
Date:  11/25/2023 Time:  1:43 PM  Group Topic/Focus:  Dimensions of Wellness:   The focus of this group is to introduce the topic of wellness and discuss the role each dimension of wellness plays in total health.   Participation Level:  Minimal  Participation Quality:  Appropriate  Affect:  Appropriate  Cognitive:  Appropriate  Insight: Appropriate  Engagement in Group:  Limited  Modes of Intervention:  Discussion and Education  Additional Comments:   Pt attended the Social Wellness group. Pt was quiet, calm and cooperative as MHT provided education of the definition of social wellness, benefits and activities to increase social wellbeing.   Gregg Crawford 11/25/2023, 1:43 PM

## 2023-11-25 NOTE — Progress Notes (Signed)
   11/25/23 1000  Psych Admission Type (Psych Patients Only)  Admission Status Voluntary  Psychosocial Assessment  Patient Complaints Anxiety  Eye Contact Brief  Facial Expression Anxious  Affect Anxious  Speech Logical/coherent  Interaction Assertive  Motor Activity Fidgety  Appearance/Hygiene Unremarkable  Behavior Characteristics Cooperative  Mood Anxious  Thought Process  Coherency WDL  Content Paranoia  Delusions Paranoid  Perception Hallucinations  Hallucination Auditory  Judgment Poor  Confusion None  Danger to Self  Current suicidal ideation? Denies  Danger to Others  Danger to Others None reported or observed   Pt endorses hallucinations and paranoia continue. Pt presents with anxious affect, denies SI

## 2023-11-25 NOTE — Group Note (Signed)
Date:  11/25/2023 Time:  12:55 PM  Group Topic/Focus:  Goals Group:   The focus of this group is to help patients establish daily goals to achieve during treatment and discuss how the patient can incorporate goal setting into their daily lives to aide in recovery. Orientation:   The focus of this group is to educate the patient on the purpose and policies of crisis stabilization and provide a format to answer questions about their admission.  The group details unit policies and expectations of patients while admitted.    Participation Level:  Minimal  Participation Quality:  Attentive  Affect:    Cognitive:  Appropriate  Insight: None  Engagement in Group:  Limited  Modes of Intervention:  Discussion, Orientation, and Rapport Building  Additional Comments:   Pt attended the Orientation and Goals group. Pt calmly expressed his frustration with being inpatient. Pt was attentive and receptive to the support offered by MHT. Pt sat quietly with his arms folded and eyes closed the duration of the group.  Gregg Crawford 11/25/2023, 12:55 PM

## 2023-11-26 DIAGNOSIS — F209 Schizophrenia, unspecified: Secondary | ICD-10-CM | POA: Diagnosis not present

## 2023-11-26 NOTE — Plan of Care (Signed)
Problem: Education: Goal: Emotional status will improve Outcome: Progressing   Problem: Activity: Goal: Interest or engagement in activities will improve Outcome: Progressing

## 2023-11-26 NOTE — Group Note (Signed)
Date:  11/26/2023 Time:  9:10 PM  Group Topic/Focus:  Wrap-Up Group:   The focus of this group is to help patients review their daily goal of treatment and discuss progress on daily workbooks.    Participation Level:  None  Participation Quality:  Inattentive  Affect:  Flat  Cognitive:  Lacking  Insight: Lacking  Engagement in Group:  Lacking  Modes of Intervention:  Discussion  Additional Comments:  Patient was present during group but did not participate   Gregg Crawford 11/26/2023, 9:10 PM

## 2023-11-26 NOTE — [facility] Group Notes (Signed)
[facility] LCSW Group Therapy Note  Date/Time:  11/26/2023/ 10:15-11:00  Type of Therapy and Topic:  Group Therapy:  Trust & Honesty   Participation Level:  Minimal   Description of Group In this group patient will be asked to explore value of being honest. Patient will be guided to discuss their thoughts, feelings, and behaviors related to honesty and trusting in others. Patient will process together how trust and honesty relate to how we form relationships with peers, family member, and self. Each patient will be challenged to identify and express feelings of being vulnerable. Patient will discuss reasons why people are dishonest and identify alternative outcomes if one was truthful to self or others). This group will be process-oriented, with patients participating in explorations of their own experiences as well as giving and receiving support and challenged from other groups members.   Therapeutic Goals Patient will identify why honesty is important to relationships and how honesty overall affects relationships. Patient will identify a situation where they lied or were lied too and the feelings, thought process, and behaviors surrounding the situation. Patient will identify the meaning of being vulnerable, how that feels, and how that correlate to being honest with self and others. Patient will identify situations where they could have told the truth, but instead lied and explain reasons of dishonesty.   Summary of Patient Progress:  Patient share that honesty is a foundation of a good real relationship. Being honest express both self respect and respect for others.     Therapeutic Modalities Cognitive Behavioral Therapy Motivational Interviewing

## 2023-11-26 NOTE — Progress Notes (Addendum)
D. Pt has been pleasant, friendly during interactions- reported improvement in AH, remarking that the Abilify has helped tone down the voices. Pt has been visible in the milieu, observed attending groups. Pt currently denies SI/HI and VH  A. Labs and vitals monitored. Pt given and educated on medications. Pt supported emotionally and encouraged to express concerns and ask questions.   R. Pt remains safe with 15 minute checks. Will continue POC.    11/26/23 1100  Psych Admission Type (Psych Patients Only)  Admission Status Voluntary  Psychosocial Assessment  Patient Complaints None  Eye Contact Fair  Facial Expression Anxious  Affect Appropriate to circumstance  Speech Logical/coherent  Interaction Assertive  Motor Activity Other (Comment) (steady gait)  Appearance/Hygiene Unremarkable  Behavior Characteristics Cooperative;Calm  Mood Anxious;Pleasant  Thought Process  Coherency WDL  Content WDL  Delusions None reported or observed  Perception Hallucinations  Hallucination Auditory  Judgment Impaired  Confusion None  Danger to Self  Current suicidal ideation? Denies  Danger to Others  Danger to Others None reported or observed

## 2023-11-26 NOTE — Progress Notes (Signed)
[facility] MD Progress Note  11/26/2023 11:00 AM PAULETTE LYNCH  MRN:  [id number] Subjective:   OLUWATOMISIN DEMAN is a 28 yr old male who presented on 1/23 to [facility] with auditory hallucinations, he was admitted to [facility] on 1/24.  PPHx is significant for Schizophrenia and Meth Abuse and 1 Prior Psychiatric Hospitalization [facility] 10/2018).   Case was discussed in the multidisciplinary team. MAR was reviewed and patient was compliant with medications.  He did not receive any PRN Medications yesterday.   Psychiatric Team made the following recommendations yesterday: -Increase Abilify to 10 mg daily for psychosis -Continue Wellbutrin XL 150 mg daily for depression -Continue Buspar 15 mg TID for anxiety   On interview today patient reports he slept good last night.  He reports his appetite is doing good.  He reports no SI, HI, or VH.  He reports no Paranoia or Ideas of Reference.  He reports no issues with his medications.  He reports that the Abilify has been very helpful.  When asked how he reports that the Seroquel would really only put him to sleep and not really make any difference with the voices they would just be lessened but get stronger throughout the day.  He reports with the Abilify the voices are much less frequent and he is able to tune them out.  Discussed with him that since he has responded well to the medication would he want to get the LAI Abilify maintain tomorrow and he is agreeable to this.  Discussed with him that he would still need to take oral Abilify for 2 weeks during this initial injection but afterwards would no longer need to take any oral Abilify.  Discussed with him that there is also now a 56-month version so if he does well with Abilify maintain that after a few months he could pursue the 61-month version and he was pleased to hear this.  He reports no other concerns at present.    Principal Problem: Schizophrenia (HCC) Diagnosis: Principal Problem:   Schizophrenia  (HCC) Active Problems:   Moderate stimulant use disorder (HCC)  Total Time spent with patient:  I personally spent 35 minutes on the unit in direct patient care. The direct patient care time included face-to-face time with the patient, reviewing the patient's chart, communicating with other professionals, and coordinating care. Greater than 50% of this time was spent in counseling or coordinating care with the patient regarding goals of hospitalization, psycho-education, and discharge planning needs.   Past Psychiatric History: Schizophrenia and Meth Abuse and 1 Prior Psychiatric Hospitalization [facility] 10/2018).  Past Medical History:  Past Medical History:  Diagnosis Date   Head injury 05/2010   reacurrent sxs dizziness, headaches,confusion had a negative MRI and EEG   Psychiatric complaint    Viral encephalitis     Past Surgical History:  Procedure Laterality Date   FRACTURE SURGERY     fracture bilateral ankles   Family History:  Family History  Problem Relation Age of Onset   Hypertension Father    Diabetes Maternal Grandfather    Early death Maternal Grandfather    Hypertension Maternal Grandfather    Heart disease Maternal Grandfather    Melanoma Mother    Family Psychiatric  History:  Patient reports, "Every male on the maternal side of my family has mental illness.   Social History:  Social History   Substance and Sexual Activity  Alcohol Use Yes   Alcohol/week: 10.0 standard drinks of alcohol   Types: 10 Cans  of beer per week   Comment: was drinking 1-2 beers daily, last drink was 10/13/18     Social History   Substance and Sexual Activity  Drug Use Yes   Frequency: 1.0 times per week   Types: Marijuana, Methamphetamines   Comment: once in last 4-5 days; before that 3 months ago; before that 2 years ago    Social History   Socioeconomic History   Marital status: Single    Spouse name: Not on file   Number of children: Not on file   Years of education:  Not on file   Highest education level: Not on file  Occupational History   Occupation: Autobody work  Tobacco Use   Smoking status: Never   Smokeless tobacco: Current    Types: Chew   Tobacco comments:    dips  Vaping Use   Vaping status: Never Used  Substance and Sexual Activity   Alcohol use: Yes    Alcohol/week: 10.0 standard drinks of alcohol    Types: 10 Cans of beer per week    Comment: was drinking 1-2 beers daily, last drink was 10/13/18   Drug use: Yes    Frequency: 1.0 times per week    Types: Marijuana, Methamphetamines    Comment: once in last 4-5 days; before that 3 months ago; before that 2 years ago   Sexual activity: Yes    Birth control/protection: Condom  Other Topics Concern   Not on file  Social History Narrative   2025 - Lives with girlfriends and 78 year old son            Lives at home with father; stays with mother and sister on occasion; Takes supplement (C-4) prior to workouts      Dr. Sharene Skeans for neuro   Dr. Beverely Low PCP at [facility]   Social Drivers of Health   Financial Resource Strain: Not on file  Food Insecurity: No Food Insecurity (11/24/2023)   Hunger Vital Sign    Worried About Running Out of Food in the Last Year: Never true    Ran Out of Food in the Last Year: Never true  Transportation Needs: No Transportation Needs (11/24/2023)   PRAPARE - Administrator, Civil Service (Medical): No    Lack of Transportation (Non-Medical): No  Physical Activity: Not on file  Stress: Not on file  Social Connections: Not on file   Additional Social History:                         Sleep: Good  Appetite:  Good  Current Medications: Current Facility-Administered Medications  Medication Dose Route Frequency Provider Last Rate Last Admin   acetaminophen (TYLENOL) tablet 650 mg  650 mg Oral Q6H PRN White, Patrice L, NP       alum & mag hydroxide-simeth (MAALOX/MYLANTA) 200-200-20 MG/5ML suspension 30 mL  30 mL Oral Q4H PRN  White, Patrice L, NP       ARIPiprazole (ABILIFY) tablet 10 mg  10 mg Oral Daily Nwoko, Agnes I, NP   10 mg at 11/26/23 0823   atomoxetine (STRATTERA) capsule 40 mg  40 mg Oral Daily Nwoko, Nicole Kindred I, NP   40 mg at 11/26/23 6578   buPROPion (WELLBUTRIN XL) 24 hr tablet 300 mg  300 mg Oral q morning Nwoko, Nicole Kindred I, NP   300 mg at 11/26/23 0823   busPIRone (BUSPAR) tablet 15 mg  15 mg Oral TID Sanjuana Kava, NP  15 mg at 11/26/23 1610   haloperidol (HALDOL) tablet 5 mg  5 mg Oral TID PRN White, Patrice L, NP       And   diphenhydrAMINE (BENADRYL) capsule 50 mg  50 mg Oral TID PRN White, Patrice L, NP       haloperidol lactate (HALDOL) injection 5 mg  5 mg Intramuscular TID PRN White, Patrice L, NP       And   diphenhydrAMINE (BENADRYL) injection 50 mg  50 mg Intramuscular TID PRN White, Patrice L, NP       And   LORazepam (ATIVAN) injection 2 mg  2 mg Intramuscular TID PRN White, Patrice L, NP       haloperidol lactate (HALDOL) injection 10 mg  10 mg Intramuscular TID PRN White, Patrice L, NP       And   diphenhydrAMINE (BENADRYL) injection 50 mg  50 mg Intramuscular TID PRN White, Patrice L, NP       And   LORazepam (ATIVAN) injection 2 mg  2 mg Intramuscular TID PRN White, Patrice L, NP       hydrOXYzine (ATARAX) tablet 25 mg  25 mg Oral TID PRN White, Patrice L, NP       magnesium hydroxide (MILK OF MAGNESIA) suspension 30 mL  30 mL Oral Daily PRN White, Patrice L, NP   30 mL at 11/26/23 0827    Lab Results:  No results found for this or any previous visit (from the past 48 hours).   Blood Alcohol level:  Lab Results  Component Value Date   ETH <10 11/10/2018    Metabolic Disorder Labs: Lab Results  Component Value Date   HGBA1C 5.1 11/23/2023   MPG 99.67 11/23/2023   MPG 99.67 11/10/2018   Lab Results  Component Value Date   PROLACTIN 25.6 (H) 11/10/2018   Lab Results  Component Value Date   CHOL 166 11/23/2023   TRIG 89 11/23/2023   HDL 52 11/23/2023   CHOLHDL 3.2  11/23/2023   VLDL 18 11/23/2023   LDLCALC 96 11/23/2023   LDLCALC 94 02/19/2020    Physical Findings: AIMS:  , ,  ,  ,    CIWA:    COWS:     Musculoskeletal: Strength & Muscle Tone: within normal limits Gait & Station: normal Patient leans: N/A  Psychiatric Specialty Exam:  Presentation  General Appearance:  Appropriate for Environment; Casual  Eye Contact: Fair  Speech: Clear and Coherent; Normal Rate  Speech Volume: Normal  Handedness: Right   Mood and Affect  Mood: -- ("ok")  Affect: Congruent   Thought Process  Thought Processes: Coherent; Goal Directed  Descriptions of Associations:Intact  Orientation:Full (Time, Place and Person)  Thought Content:Logical; WDL  History of Schizophrenia/Schizoaffective disorder:Yes  Duration of Psychotic Symptoms:Greater than six months  Hallucinations:Hallucinations: Auditory Description of Auditory Hallucinations: improved and less frequent, able to tune out  Ideas of Reference:None  Suicidal Thoughts:Suicidal Thoughts: No  Homicidal Thoughts:Homicidal Thoughts: No   Sensorium  Memory: Immediate Good; Recent Good  Judgment: Fair  Insight: Good   Executive Functions  Concentration: Good  Attention Span: Good  Recall: Good  Fund of Knowledge: Good  Language: Good   Psychomotor Activity  Psychomotor Activity: Psychomotor Activity: Normal   Assets  Assets: Communication Skills; Desire for Improvement; Financial Resources/Insurance; Physical Health; Resilience; Social Support   Sleep  Sleep: Sleep: Good Number of Hours of Sleep: 8.25    Physical Exam: Physical Exam Vitals and nursing note reviewed.  Constitutional:      General: He is not in acute distress.    Appearance: Normal appearance. He is normal weight. He is not ill-appearing or toxic-appearing.  HENT:     Head: Normocephalic and atraumatic.  Pulmonary:     Effort: Pulmonary effort is normal.   Musculoskeletal:        General: Normal range of motion.  Neurological:     General: No focal deficit present.     Mental Status: He is alert.    Review of Systems  Respiratory:  Negative for cough and shortness of breath.   Cardiovascular:  Negative for chest pain.  Gastrointestinal:  Negative for abdominal pain, constipation, diarrhea, nausea and vomiting.  Neurological:  Negative for dizziness, weakness and headaches.  Psychiatric/Behavioral:  Positive for hallucinations (AH- improved). Negative for depression and suicidal ideas. The patient is not nervous/anxious.    Blood pressure 124/88, pulse 97, temperature (!) 97.5 F (36.4 C), temperature source Oral, resp. rate 12, height 5\' 10"  (1.778 m), weight 96.2 kg, SpO2 98%. Body mass index is 30.42 kg/m.   Treatment Plan Summary: Daily contact with patient to assess and evaluate symptoms and progress in treatment and Medication management  OGLE HOEFFNER is a 28 yr old male who presented on 1/23 to [facility] with auditory hallucinations, he was admitted to [facility] on 1/24.  PPHx is significant for Schizophrenia and Meth Abuse and 1 Prior Psychiatric Hospitalization [facility] 10/2018).   Chrles has responded well to the Abilify.  We will plan with administering the Abilify maintain tomorrow.  If he tolerates this well we could consider discharge on Tuesday.  We will not make any changes to his medications at this time.  We will continue to monitor.   Schizophrenia: -Continue Abilify 10 mg daily for psychosis  -Continue Wellbutrin XL 150 mg daily for depression -Continue Buspar 15 mg TID for anxiety -Continue Agitation Protocol: Haldol/Ativan/Benadryl   -Continue Strattera 40 mg daily for attention/concentration  -Continue PRN's: Tylenol, Maalox, Atarax, Milk of Magnesia   Lauro Franklin, MD 11/26/2023, 11:00 AM

## 2023-11-26 NOTE — Progress Notes (Signed)
Pt presents as bright and easy going.  Engages easily in superficial conversation with flattery, laughing and joking.  Pt became intense when discussing AH and endorsed paranoia that family is speaking ill of him.  He verbalized it's the voices lying to him and are evil in nature.  He stated that he believes in God and when he reads the bible the evil disappears immediately.     11/25/23 2100  Psych Admission Type (Psych Patients Only)  Admission Status Voluntary  Psychosocial Assessment  Patient Complaints Sleep disturbance;Other (Comment) (Requested sleep meds--not able to sleep last night since seroquel was discontinued)  Eye Contact Fair (Engaged)  Facial Expression Pensive;Worried  Affect Appropriate to circumstance  Speech Logical/coherent  Interaction Other (Comment) (Uses flattery and engages in conversation easily, laughing and joking with staff.  Became serious/intense when discussing AH, forthcoming and appears to be deeply affected by them d/t evil nature.  Appeared disturbed/worried)  Motor Activity Restless  Appearance/Hygiene Unremarkable  Behavior Characteristics Appropriate to situation;Calm  Mood Ashamed/humiliated;Other (Comment) (Pt reported he has a 29 year old boy and sometimes he is paranoid he hears family members voices but then realizes it's not them.  He stated that he thinks it's spiritual in an evil sense.)  Thought Process  Coherency WDL  Content Paranoia (Endorsed paranoia d/t AH)  Delusions Other (Comment) (Evil in nature)  Perception WDL  Hallucination Auditory  Judgment Impaired  Confusion None  Danger to Self  Current suicidal ideation? Denies  Agreement Not to Harm Self Yes  Description of Agreement Verbal  Danger to Others  Danger to Others None reported or observed

## 2023-11-26 NOTE — Group Note (Signed)
Date:  11/26/2023 Time:  11:40 AM  Group Topic/Focus:  Coping With Mental Health Crisis:   The purpose of this group is to help patients identify strategies for coping with mental health crisis.  Group discusses possible causes of crisis and ways to manage them effectively.    Participation Level:  Minimal  Participation Quality:  Appropriate  Affect:  Appropriate  Cognitive:  Appropriate  Insight: Limited  Engagement in Group:  Limited  Modes of Intervention:  Education  Additional Comments:    Santhiago Collingsworth D Stephanee Barcomb 11/26/2023, 11:40 AM

## 2023-11-26 NOTE — Plan of Care (Signed)
Problem: Activity: Goal: Interest or engagement in activities will improve Outcome: Progressing   Problem: Coping: Goal: Ability to verbalize frustrations and anger appropriately will improve Outcome: Progressing Goal: Ability to demonstrate self-control will improve Outcome: Progressing

## 2023-11-26 NOTE — Progress Notes (Signed)
   11/26/23 2100  Psych Admission Type (Psych Patients Only)  Admission Status Voluntary  Psychosocial Assessment  Patient Complaints Sleep disturbance (Pt reports waking up during the night sometimes and then having a hard time falling back asleep.)  Eye Contact Fair  Facial Expression Anxious  Affect Anxious;Appropriate to circumstance  Speech Logical/coherent  Interaction Assertive  Motor Activity Other (Comment) (WDL)  Appearance/Hygiene Unremarkable  Behavior Characteristics Cooperative;Appropriate to situation;Anxious  Mood Anxious;Pleasant  Thought Process  Coherency WDL  Content WDL  Delusions None reported or observed  Perception Hallucinations  Hallucination Auditory (AH of voices that he finds annoying and are constantly saying positive, uplifting things to him and/or negative things.)  Judgment Poor  Confusion None  Danger to Self  Current suicidal ideation? Denies  Agreement Not to Harm Self Yes  Description of Agreement verbally contracts for safety  Danger to Others  Danger to Others None reported or observed

## 2023-11-27 ENCOUNTER — Other Ambulatory Visit (HOSPITAL_COMMUNITY): Payer: Self-pay

## 2023-11-27 DIAGNOSIS — F209 Schizophrenia, unspecified: Secondary | ICD-10-CM | POA: Diagnosis not present

## 2023-11-27 MED ORDER — ARIPIPRAZOLE ER 400 MG IM SRER
400.0000 mg | INTRAMUSCULAR | Status: DC
  Administered 2023-11-27: 400 mg via INTRAMUSCULAR
  Filled 2023-11-27: qty 2

## 2023-11-27 NOTE — [facility] Suicide Risk Assessment (Signed)
[facility] INPATIENT:  Family/Significant Other Suicide Prevention Education  Suicide Prevention Education:  Education Completed;  gf, Radene Gunning [phone],  (name of family member/significant other) has been identified by the patient as the family member/significant other with whom the patient will be residing, and identified as the person(s) who will aid the patient in the event of a mental health crisis (suicidal ideations/suicide attempt).  With written consent from the patient, the family member/significant other has been provided the following suicide prevention education, prior to the and/or following the discharge of the patient.  The suicide prevention education provided includes the following: Suicide risk factors Suicide prevention and interventions National Suicide Hotline telephone number [facility] assessment telephone number [facility] Emergency Assistance 911 [facility] and/or Residential Mobile Crisis Unit telephone number  Request made of family/significant other to: Remove weapons (e.g., guns, rifles, knives), all items previously/currently identified as safety concern.   Remove drugs/medications (over-the-counter, prescriptions, illicit drugs), all items previously/currently identified as a safety concern.  The family member/significant other verbalizes understanding of the suicide prevention education information provided.  The family member/significant other agrees to remove the items of safety concern listed above.  Steffanie Dunn LCSWA 11/27/2023, 10:22 AM

## 2023-11-27 NOTE — Plan of Care (Signed)
Problem: Education: Goal: Knowledge of [facility] Education information/materials will improve Outcome: Progressing   Problem: Activity: Goal: Interest or engagement in activities will improve Outcome: Progressing   Problem: Coping: Goal: Ability to verbalize frustrations and anger appropriately will improve Outcome: Progressing

## 2023-11-27 NOTE — Progress Notes (Signed)
[facility] MD Progress Note  11/27/2023 1:30 PM ASAH LAMAY  MRN:  [id number]  Reason for admission:   TRELON PLUSH is a 28 yr old male who presented on 1/23 to [facility] with auditory hallucinations, he was admitted to [facility] on 1/24.  PPHx is significant for Schizophrenia and Meth Abuse and 1 Prior Psychiatric Hospitalization [facility] 10/2018).  Case was discussed in the multidisciplinary team. MAR was reviewed and patient was compliant with medications.  He  received Tylenol PRN for mild pain yesterday.  Psychiatric Team made the following recommendations yesterday: -Continue Abilify to 10 mg daily for psychosis -Continue Wellbutrin XL 300 mg daily for depression -Continue Buspar 15 mg TID for anxiety  Today's assessment notes: On today's assessment, patient is seen and examined sitting up in his bed in his room.  He presents alert, calm, cooperative, and oriented to person, place, time, and situation.  Reports his mood is less depressed with congruent affect.  Chart reviewed and findings shared with the treatment team and consult with attending psychiatrist.  Report being compliance with his medications and appears to have good knowledge of all his psychotropic meds.  Patient added that he was supposed to receive Abilify Maintena LAI prior to discharge.  Information shared with the pharmacist and attending psychiatrist.  Patient does not have insurance, however, [facility] is willing to provide Abilify Maintena LAI 400 mg IM and to continue with his Abilify tablets as ordered until he is sure that subsequent LAI injection will be available and covered.  Patient is in agreement with this recommendation.  He denies delusional thinking or paranoia.  He further denies SI, HI, or AVH. Reports that anxiety symptoms are at manageable level.  Sleep is stable. Appetite is stable.  Concentration is without complaint.  Energy level is adequate. Denies having any suicidal thoughts. Denies having any suicidal intent and  plan.  Denies having any HI.  Denies having psychotic symptoms.   Denies having side effects to current psychiatric medications.   Discussed discharge planning: How to identify the signs of impending crisis, use of internal coping strategies, reaching out to friends and family that can help navigate a crisis, and a list of mental health professionals and agencies to call. Further to follow up on her mental health appointments and her PCP appointments.  Principal Problem: Schizophrenia (HCC) Diagnosis: Principal Problem:   Schizophrenia (HCC) Active Problems:   Moderate stimulant use disorder (HCC)  Total Time spent with patient: 45 minutes  Past Psychiatric History: Schizophrenia and Meth Abuse and 1 Prior Psychiatric Hospitalization [facility] 10/2018).  Past Medical History:  Past Medical History:  Diagnosis Date   Head injury 05/2010   reacurrent sxs dizziness, headaches,confusion had a negative MRI and EEG   Psychiatric complaint    Viral encephalitis     Past Surgical History:  Procedure Laterality Date   FRACTURE SURGERY     fracture bilateral ankles   Family History:  Family History  Problem Relation Age of Onset   Hypertension Father    Diabetes Maternal Grandfather    Early death Maternal Grandfather    Hypertension Maternal Grandfather    Heart disease Maternal Grandfather    Melanoma Mother    Family Psychiatric  History:  Patient reports, "Every male on the maternal side of my family has mental illness.   Social History:  Social History   Substance and Sexual Activity  Alcohol Use Yes   Alcohol/week: 10.0 standard drinks of alcohol   Types: 10 Cans of  beer per week   Comment: was drinking 1-2 beers daily, last drink was 10/13/18     Social History   Substance and Sexual Activity  Drug Use Yes   Frequency: 1.0 times per week   Types: Marijuana, Methamphetamines   Comment: once in last 4-5 days; before that 3 months ago; before that 2 years ago    Social  History   Socioeconomic History   Marital status: Single    Spouse name: Not on file   Number of children: Not on file   Years of education: Not on file   Highest education level: Not on file  Occupational History   Occupation: Autobody work  Tobacco Use   Smoking status: Never   Smokeless tobacco: Current    Types: Chew   Tobacco comments:    dips  Vaping Use   Vaping status: Never Used  Substance and Sexual Activity   Alcohol use: Yes    Alcohol/week: 10.0 standard drinks of alcohol    Types: 10 Cans of beer per week    Comment: was drinking 1-2 beers daily, last drink was 10/13/18   Drug use: Yes    Frequency: 1.0 times per week    Types: Marijuana, Methamphetamines    Comment: once in last 4-5 days; before that 3 months ago; before that 2 years ago   Sexual activity: Yes    Birth control/protection: Condom  Other Topics Concern   Not on file  Social History Narrative   2025 - Lives with girlfriends and 27 year old son            Lives at home with father; stays with mother and sister on occasion; Takes supplement (C-4) prior to workouts      Dr. Sharene Skeans for neuro   Dr. Beverely Low PCP at [facility]   Social Drivers of Health   Financial Resource Strain: Not on file  Food Insecurity: No Food Insecurity (11/24/2023)   Hunger Vital Sign    Worried About Running Out of Food in the Last Year: Never true    Ran Out of Food in the Last Year: Never true  Transportation Needs: No Transportation Needs (11/24/2023)   PRAPARE - Administrator, Civil Service (Medical): No    Lack of Transportation (Non-Medical): No  Physical Activity: Not on file  Stress: Not on file  Social Connections: Not on file   Additional Social History:    Sleep: Good  Appetite:  Good  Current Medications: Current Facility-Administered Medications  Medication Dose Route Frequency Provider Last Rate Last Admin   acetaminophen (TYLENOL) tablet 650 mg  650 mg Oral Q6H PRN White, Patrice  L, NP   650 mg at 11/26/23 1607   alum & mag hydroxide-simeth (MAALOX/MYLANTA) 200-200-20 MG/5ML suspension 30 mL  30 mL Oral Q4H PRN White, Patrice L, NP       ARIPiprazole (ABILIFY) tablet 10 mg  10 mg Oral Daily Nwoko, Agnes I, NP   10 mg at 11/27/23 9147   atomoxetine (STRATTERA) capsule 40 mg  40 mg Oral Daily Nwoko, Nicole Kindred I, NP   40 mg at 11/27/23 8295   buPROPion (WELLBUTRIN XL) 24 hr tablet 300 mg  300 mg Oral q morning Nwoko, Agnes I, NP   300 mg at 11/27/23 0813   busPIRone (BUSPAR) tablet 15 mg  15 mg Oral TID Armandina Stammer I, NP   15 mg at 11/27/23 1305   haloperidol (HALDOL) tablet 5 mg  5 mg Oral TID  PRN White, Patrice L, NP       And   diphenhydrAMINE (BENADRYL) capsule 50 mg  50 mg Oral TID PRN White, Patrice L, NP       haloperidol lactate (HALDOL) injection 5 mg  5 mg Intramuscular TID PRN White, Patrice L, NP       And   diphenhydrAMINE (BENADRYL) injection 50 mg  50 mg Intramuscular TID PRN White, Patrice L, NP       And   LORazepam (ATIVAN) injection 2 mg  2 mg Intramuscular TID PRN White, Patrice L, NP       haloperidol lactate (HALDOL) injection 10 mg  10 mg Intramuscular TID PRN White, Patrice L, NP       And   diphenhydrAMINE (BENADRYL) injection 50 mg  50 mg Intramuscular TID PRN White, Patrice L, NP       And   LORazepam (ATIVAN) injection 2 mg  2 mg Intramuscular TID PRN White, Patrice L, NP       hydrOXYzine (ATARAX) tablet 25 mg  25 mg Oral TID PRN White, Patrice L, NP       magnesium hydroxide (MILK OF MAGNESIA) suspension 30 mL  30 mL Oral Daily PRN White, Patrice L, NP   30 mL at 11/26/23 0827   Lab Results:  No results found for this or any previous visit (from the past 48 hours).  Blood Alcohol level:  Lab Results  Component Value Date   ETH <10 11/10/2018   Metabolic Disorder Labs: Lab Results  Component Value Date   HGBA1C 5.1 11/23/2023   MPG 99.67 11/23/2023   MPG 99.67 11/10/2018   Lab Results  Component Value Date   PROLACTIN 25.6 (H)  11/10/2018   Lab Results  Component Value Date   CHOL 166 11/23/2023   TRIG 89 11/23/2023   HDL 52 11/23/2023   CHOLHDL 3.2 11/23/2023   VLDL 18 11/23/2023   LDLCALC 96 11/23/2023   LDLCALC 94 02/19/2020   Physical Findings: AIMS:  , ,  ,  ,    CIWA:    COWS:     Musculoskeletal: Strength & Muscle Tone: within normal limits Gait & Station: normal Patient leans: N/A  Psychiatric Specialty Exam:  Presentation  General Appearance:  Appropriate for Environment; Casual; Fairly Groomed  Eye Contact: Good  Speech: Clear and Coherent; Normal Rate  Speech Volume: Normal  Handedness: Right  Mood and Affect  Mood: Anxious; Depressed  Affect: Congruent  Thought Process  Thought Processes: Coherent  Descriptions of Associations:Intact  Orientation:Full (Time, Place and Person)  Thought Content:Logical; WDL  History of Schizophrenia/Schizoaffective disorder:Yes  Duration of Psychotic Symptoms:Greater than six months  Hallucinations:Hallucinations: -- (Denies today) Description of Auditory Hallucinations: Denies  Ideas of Reference:None  Suicidal Thoughts:Suicidal Thoughts: No  Homicidal Thoughts:Homicidal Thoughts: No  Sensorium  Memory: Immediate Good; Recent Good  Judgment: Fair  Insight: Fair  Art therapist  Concentration: Good  Attention Span: Good  Recall: Fair  Fund of Knowledge: Good  Language: Good  Psychomotor Activity  Psychomotor Activity: Psychomotor Activity: Normal  Assets  Assets: Communication Skills; Desire for Improvement; Physical Health; Resilience  Sleep  Sleep: Sleep: Good Number of Hours of Sleep: 8  Physical Exam: Physical Exam Vitals and nursing note reviewed.  Constitutional:      General: He is not in acute distress.    Appearance: Normal appearance. He is normal weight. He is not ill-appearing or toxic-appearing.  HENT:     Head: Normocephalic and atraumatic.  Nose: Nose normal.   Eyes:     Extraocular Movements: Extraocular movements intact.  Cardiovascular:     Rate and Rhythm: Normal rate.     Pulses: Normal pulses.  Pulmonary:     Effort: Pulmonary effort is normal.  Abdominal:     Comments: Deferred  Genitourinary:    Comments: Deferred Musculoskeletal:        General: Normal range of motion.     Cervical back: Normal range of motion.  Skin:    General: Skin is warm.  Neurological:     General: No focal deficit present.     Mental Status: He is alert and oriented to person, place, and time.  Psychiatric:        Mood and Affect: Mood normal.        Behavior: Behavior normal.        Thought Content: Thought content normal.    Review of Systems  Respiratory:  Negative for cough and shortness of breath.   Cardiovascular:  Negative for chest pain.  Gastrointestinal:  Negative for abdominal pain, constipation, diarrhea, nausea and vomiting.  Neurological:  Negative for dizziness, weakness and headaches.  Psychiatric/Behavioral:  Positive for hallucinations (AH- improved). Negative for depression and suicidal ideas. The patient is not nervous/anxious.    Blood pressure (!) 136/95, pulse 94, temperature 97.8 F (36.6 C), temperature source Oral, resp. rate 12, height 5\' 10"  (1.778 m), weight 96.2 kg, SpO2 100%. Body mass index is 30.42 kg/m.  Treatment Plan Summary: Daily contact with patient to assess and evaluate symptoms and progress in treatment and Medication management  TAREEK SABO is a 28 yr old male who presented on 1/23 to [facility] with auditory hallucinations, he was admitted to [facility] on 1/24.  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  Kendall has responded well to the Abilify.  We will plan with administering the Abilify maintain tomorrow.  If he tolerates this well we could consider discharge on Tuesday.  We will not make any changes to his medications at this time.  We will  continue to monitor.  Schizophrenia: -Initiate Abilify Maintena LAI 400 mg IM q. 28 days today. -Continue Abilify 10 mg daily for psychosis  -Continue Wellbutrin XL 300 mg daily for depression -Continue Buspar 15 mg TID for anxiety -Continue Agitation Protocol: Haldol/Ativan/Benadryl  -Continue Strattera 40 mg daily for attention/concentration  -Continue PRN's: Tylenol, Maalox, Atarax, Milk of Magnesia  Cecilie Lowers, FNP 11/27/2023, 1:30 PM Patient ID: [name], male   DOB: 01/30/1996, 28 y.o.   MRN: [id number]

## 2023-11-27 NOTE — Plan of Care (Signed)
  Problem: Coping: Goal: Coping ability will improve Outcome: Progressing   Problem: Self-Care: Goal: Ability to participate in self-care as condition permits will improve Outcome: Progressing   Problem: Self-Concept: Goal: Will verbalize positive feelings about self Outcome: Progressing

## 2023-11-27 NOTE — Group Note (Signed)
Date:  11/27/2023 Time:  8:54 PM  Group Topic/Focus:  Wrap-Up Group:   The focus of this group is to help patients review their daily goal of treatment and discuss progress on daily workbooks.    Participation Level:  None  Participation Quality:  Resistant  Affect:  Flat  Cognitive:  Lacking  Insight: Lacking  Engagement in Group:  None  Modes of Intervention:  Discussion  Additional Comments:  Patient was present but did not participate in wrap up group  Gregg Crawford 11/27/2023, 8:54 PM

## 2023-11-27 NOTE — Group Note (Signed)
Recreation Therapy Group Note   Group Topic:Stress Management  Group Date: 11/27/2023 Start Time: 0932 End Time: 1191 Facilitators: Andrya Roppolo-McCall, LRT,CTRS Location: 300 Hall Dayroom   Group Topic: Stress Management  Goal Area(s) Addresses:  Patient will identify positive stress management techniques. Patient will identify benefits of using stress management post d/c.  Intervention: Meditation App   Activity: Meditation. LRT played a meditation that focused on over thinking situations. The meditation focused on how to control anxiety, think positive thoughts and stay in the moment as opposed to thinking ahead about situations beyond your control.     Education:  Stress Management, Discharge Planning.   Education Outcome: Acknowledges Education   Affect/Mood: Appropriate   Participation Level: Active   Participation Quality: Independent   Behavior: Attentive    Speech/Thought Process: Focused   Insight: Good   Judgement: Good   Modes of Intervention: Meditation   Patient Response to Interventions:  Engaged   Education Outcome:  In group clarification offered    Clinical Observations/Individualized Feedback: Pt attended and participated in group session.     Plan: Continue to engage patient in RT group sessions 2-3x/week.   Knox Holdman-McCall, LRT,CTRS  11/27/2023 12:08 PM

## 2023-11-27 NOTE — Progress Notes (Signed)
   11/27/23 0545  15 Minute Checks  Location Bedroom  Visual Appearance Calm  Behavior Sleeping  Sleep (Behavioral Health Patients Only)  Calculate sleep? (Click Yes once per 24 hr at 0600 safety check) Yes  Documented sleep last 24 hours 7

## 2023-11-27 NOTE — BHH Group Notes (Signed)
The focus of this group is to help patients establish daily goals to achieve during treatment and discuss how the patient can incorporate goal setting into their daily lives to aide in recovery.   Scale 1-10   2 out of 10   Goal:  to discharge

## 2023-11-27 NOTE — Progress Notes (Signed)
   11/27/23 0800  Psych Admission Type (Psych Patients Only)  Admission Status Voluntary  Psychosocial Assessment  Patient Complaints Sleep disturbance  Eye Contact Fair  Facial Expression Anxious  Affect Anxious;Appropriate to circumstance  Speech Logical/coherent  Interaction Assertive  Motor Activity Other (Comment) (WDL)  Appearance/Hygiene Unremarkable  Behavior Characteristics Cooperative;Appropriate to situation  Mood Anxious;Pleasant  Thought Process  Coherency WDL  Content WDL  Delusions None reported or observed  Perception Hallucinations  Hallucination Auditory  Judgment Poor  Confusion None  Danger to Self  Current suicidal ideation? Denies  Agreement Not to Harm Self Yes  Description of Agreement Verbally Contracts for safety  Danger to Others  Danger to Others None reported or observed

## 2023-11-28 DIAGNOSIS — F209 Schizophrenia, unspecified: Secondary | ICD-10-CM | POA: Diagnosis not present

## 2023-11-28 MED ORDER — BUSPIRONE HCL 15 MG PO TABS: 15.0000 mg | ORAL_TABLET | Freq: Three times a day (TID) | ORAL | 0 refills | Status: AC

## 2023-11-28 MED ORDER — BUPROPION HCL ER (XL) 300 MG PO TB24: 300.0000 mg | ORAL_TABLET | Freq: Every morning | ORAL | 0 refills | Status: AC

## 2023-11-28 MED ORDER — ARIPIPRAZOLE 10 MG PO TABS: 10.0000 mg | ORAL_TABLET | Freq: Every day | ORAL | 0 refills | Status: AC

## 2023-11-28 MED ORDER — ATOMOXETINE HCL 40 MG PO CAPS: 40.0000 mg | ORAL_CAPSULE | Freq: Every day | ORAL | 0 refills | Status: AC

## 2023-11-28 MED ORDER — HYDROXYZINE HCL 25 MG PO TABS: 25.0000 mg | ORAL_TABLET | Freq: Three times a day (TID) | ORAL | 0 refills | Status: AC | PRN

## 2023-11-28 MED ORDER — ARIPIPRAZOLE ER 400 MG IM SRER: 400.0000 mg | INTRAMUSCULAR | 0 refills | Status: AC

## 2023-11-28 NOTE — BHH Suicide Risk Assessment (Signed)
Suicide Risk Assessment  Discharge Assessment    [facility] Discharge Suicide Risk Assessment   Principal Problem: Schizophrenia [facility]) Discharge Diagnoses: Principal Problem:   Schizophrenia (HCC) Active Problems:   Moderate stimulant use disorder [facility])  Reason for admission: This is an admission evaluation for this 28 year old Caucasian male with prior hx of schizophrenia. Admitted to the [facility] from the [facility] with complaint of worsening auditory/visual hallucinations of 8 months that worsened in the last 3 months. After evaluation at the [facility], patient was transferred to the [facility] for further psychiatric evaluation/treatments. A review of his current lab results has shown his UDS positive for methamphetamine & THC.   Total Time spent with patient: 30 minutes  Musculoskeletal: Strength & Muscle Tone: within normal limits Gait & Station: normal Patient leans: N/A  Psychiatric Specialty Exam  Presentation  General Appearance:  Appropriate for Environment; Casual  Eye Contact: Good  Speech: Clear and Coherent  Speech Volume: Normal  Handedness: Right  Mood and Affect  Mood: Euthymic  Duration of Depression Symptoms: Greater than two weeks  Affect: Appropriate; Congruent  Thought Process  Thought Processes: Coherent  Descriptions of Associations:Intact  Orientation:Full (Time, Place and Person)  Thought Content:Logical  History of Schizophrenia/Schizoaffective disorder:Yes  Duration of Psychotic Symptoms:Greater than six months  Hallucinations:Hallucinations: None Description of Auditory Hallucinations: Denies  Ideas of Reference:None  Suicidal Thoughts:Suicidal Thoughts: No  Homicidal Thoughts:Homicidal Thoughts: No  Sensorium  Memory: Immediate Good; Recent Good  Judgment: Fair  Insight: Fair  Art therapist  Concentration: Good  Attention Span: Good  Recall: Fair  Fund of Knowledge: Fair  Language: Good  Psychomotor Activity   Psychomotor Activity: Psychomotor Activity: Normal  Assets  Assets: Communication Skills; Desire for Improvement; Physical Health; Resilience  Sleep  Sleep: Sleep: Good Number of Hours of Sleep: 8.25  Physical Exam: Physical Exam Vitals and nursing note reviewed.  Constitutional:      General: He is not in acute distress.    Appearance: Normal appearance. He is not ill-appearing or toxic-appearing.  HENT:     Head: Normocephalic.     Nose: Nose normal.     Mouth/Throat:     Mouth: Mucous membranes are moist.     Pharynx: Oropharynx is clear.  Eyes:     Extraocular Movements: Extraocular movements intact.  Cardiovascular:     Rate and Rhythm: Normal rate.     Pulses: Normal pulses.  Pulmonary:     Effort: Pulmonary effort is normal.  Abdominal:     Comments: Deferred  Genitourinary:    Comments: Deferred Musculoskeletal:        General: Normal range of motion.     Cervical back: Normal range of motion.  Skin:    General: Skin is warm.  Neurological:     General: No focal deficit present.     Mental Status: He is alert and oriented to person, place, and time. Mental status is at baseline.  Psychiatric:        Mood and Affect: Mood normal.        Behavior: Behavior normal.        Thought Content: Thought content normal.    Review of Systems  Constitutional:  Negative for chills and fever.  HENT:  Negative for sore throat.   Eyes:  Negative for blurred vision.  Respiratory:  Negative for cough, sputum production, shortness of breath and wheezing.   Cardiovascular:  Negative for chest pain and palpitations.  Gastrointestinal:  Negative for abdominal pain, constipation, diarrhea,  heartburn, nausea and vomiting.  Genitourinary:  Negative for dysuria, frequency and urgency.  Musculoskeletal:  Negative for back pain, myalgias and neck pain.  Skin:  Negative for itching and rash.  Neurological:  Negative for dizziness, tingling and headaches.  Endo/Heme/Allergies:         See allergy listing  Psychiatric/Behavioral:  Negative for depression, hallucinations and suicidal ideas. The patient is not nervous/anxious.    Blood pressure (!) 124/94, pulse 75, temperature 97.7 F (36.5 C), temperature source Oral, resp. rate 18, height 5\' 10"  (1.778 m), weight 96.2 kg, SpO2 100%. Body mass index is 30.42 kg/m.  Mental Status Per Nursing Assessment::   On Admission:  NA  Demographic Factors:  Male, Adolescent or young adult, Caucasian, and Low socioeconomic status  Loss Factors: NA  Historical Factors: Family history of mental illness or substance abuse  Risk Reduction Factors:   Responsible for children under 47 years of age, Sense of responsibility to family, Religious beliefs about death, Employed, Living with another person, especially a relative, Positive social support, Positive therapeutic relationship, and Positive coping skills or problem solving skills  Continued Clinical Symptoms:  Depression:   Recent sense of peace/wellbeing Alcohol/Substance Abuse/Dependencies Schizophrenia:   Paranoid or undifferentiated type More than one psychiatric diagnosis Previous Psychiatric Diagnoses and Treatments  Cognitive Features That Contribute To Risk:  Polarized thinking    Suicide Risk:  Mild: There are no identifiable plans, no associated intent, mild dysphoria and related symptoms, good self-control (both objective and subjective assessment), few other risk factors, and identifiable protective factors, including available and accessible social support.   Follow-up Information     Services, [facility]. Go on 11/30/2023.   Why: You have a hospital follow up appointment to obtain group therapy and medication management services on 11/30/23 at 9:00 am, in person. Contact information: [address] [place] [address] [phone]                Plan Of Care/Follow-up recommendations:  Discharge Recommendations:  The patient is  being discharged to home. Patient is to take his discharge medications as ordered.  See follow up above. We recommend that he participates in individual therapy to target uncontrollable agitation and substance abuse.  We recommend that he participates in therapy to target personal conflict, to improve communication skills and conflict resolution skills. Patient is to initiate/implement a contingency based behavioral model to address his behavior. We recommend that he gets AIMS scale, height, weight, blood pressure, fasting lipid panel, fasting blood sugar in three months from discharge if he's on atypical antipsychotics.  Patient will benefit from monitoring of recurrent suicidal ideation since patient is on antidepressant medication. The patient should abstain from all illicit substances and alcohol. If the patient's symptoms worsen or do not continue to improve or if the patient becomes actively suicidal or homicidal then it is recommended that the patient return to the closest hospital emergency room or call 911 for further evaluation and treatment. National Suicide Prevention Lifeline 1800-SUICIDE or (941)169-1735. Please follow up with your primary medical doctor for all other medical needs.  The patient has been educated on the possible side effects to medications and she/her guardian is to contact a medical professional and inform outpatient provider of any new side effects of medication. He is to take regular diet and activity as tolerated.  Will benefit from moderate daily exercise. Patient and Family was educated about removing/locking any firearms, medications or dangerous products from the home.  Activity:  As  tolerated Diet:  Regular Diet  Cecilie Lowers, FNP 11/28/2023, 10:05 AM

## 2023-11-28 NOTE — Discharge Instructions (Addendum)
-  Follow-up with your outpatient psychiatric provider -instructions on appointment date, time, and address (location) are provided to you in discharge paperwork.  -Take your psychiatric medications as prescribed at discharge - instructions are provided to you in the discharge paperwork  You received the abilify maintena LAI 400 mg on 11-27-23. Your next dose is due in 28 days (12-25-23).  You will continue abilify oral tablets for 10 days, then stop taking the oral tablets, as you are on the LAI now.  If you have trouble with affording the LAI, you can always resume taking the oral tablets.   -Follow-up with outpatient primary care doctor and other specialists -for management of preventative medicine and any chronic medical disease.  -Recommend abstinence from alcohol, tobacco, and other illicit drug use at discharge.   -If your psychiatric symptoms recur, worsen, or if you have side effects to your psychiatric medications, call your outpatient psychiatric provider, 911, [phone] or go to the nearest emergency department.  -If suicidal thoughts occur, call your outpatient psychiatric provider, 911, [phone] or go to the nearest emergency department.  Naloxone (Narcan) can help reverse an overdose when given to the victim quickly.  [facility] offers free naloxone kits and instructions/training on its use.  Add naloxone to your first aid kit and you can help save a life.   Pick up your free kit at the following locations:   [place]:  [facility], [address] [place] [address] [phone]) [facility] [address] [place] [address] [phone]) [facility] [address] [place] [address]  [place]: [facility] [address] [place] [address] ([phone]) [facility] [address] [place] [address]  [phone])

## 2023-11-28 NOTE — Group Note (Signed)
Date:  11/28/2023 Time:  12:51 PM  Group Topic/Focus:  Goals Group:   The focus of this group is to help patients establish daily goals to achieve during treatment and discuss how the patient can incorporate goal setting into their daily lives to aide in recovery. Orientation:   The focus of this group is to educate the patient on the purpose and policies of crisis stabilization and provide a format to answer questions about their admission.  The group details unit policies and expectations of patients while admitted.    Participation Level:  Active  Participation Quality:  Appropriate  Affect:  Appropriate  Cognitive:  Appropriate  Insight: Appropriate  Engagement in Group:  Engaged  Modes of Intervention:  Discussion, Orientation, and Rapport Building  Additional Comments:   Pt attended the Orientation and Goals group. Pt personal goal for today is discharge from treatment.  Edmund Hilda Carlus Stay 11/28/2023, 12:51 PM

## 2023-11-28 NOTE — Progress Notes (Signed)
Patient is discharging at this time. Patient is A&Ox4. Stable. Patient denies SI,HI, and A/V/H with no plan/intent. Printed AVS reviewed with and given to patient along with medications and follow up appointments. Suicide safety plan complete with copy provided to patient. Original form in chart. Patient survey complete. Patient verbalized all understanding. All valuables/belongings returned to patient. Patient is being transported by his fiance. Patient denies any pain/discomfort. No s/s of current distress.

## 2023-11-28 NOTE — Discharge Summary (Signed)
Physician Discharge Summary Note  Patient:  Gregg Crawford is an 28 y.o., male MRN:  [id number] DOB:  [birth year] Patient phone:  [phone] (home)  Patient address:   [address] [place] [address],   Total Time spent with patient: 30 minutes  Date of Admission:  11/23/2023 Date of Discharge:  11/28/2023  Reason for Admission:  This is an admission evaluation for this 28 year old Caucasian male with prior hx of schizophrenia. Admitted to the [facility] from the [facility] with complaint of worsening auditory/visual hallucinations of 8 months that worsened in the last 3 months. After evaluation at the [facility], patient was transferred to the [facility] for further psychiatric evaluation/treatments. A review of his current lab results has shown his UDS positive for methamphetamine & THC.   Principal Problem: Schizophrenia [facility]) Discharge Diagnoses: Principal Problem:   Schizophrenia (HCC) Active Problems:   Moderate stimulant use disorder [facility])  Past Psychiatric History: Patient was admitted in this [facility] in 2020, received treatment for schizophrenia. Has hx of & current use of methamphetamine.   Past Medical History:  Past Medical History:  Diagnosis Date   Head injury 05/2010   reacurrent sxs dizziness, headaches,confusion had a negative MRI and EEG   Psychiatric complaint    Viral encephalitis     Past Surgical History:  Procedure Laterality Date   FRACTURE SURGERY     fracture bilateral ankles   Family History:  Family History  Problem Relation Age of Onset   Hypertension Father    Diabetes Maternal Grandfather    Early death Maternal Grandfather    Hypertension Maternal Grandfather    Heart disease Maternal Grandfather    Melanoma Mother    Family Psychiatric  History: See H&P Social History:  Social History   Substance and Sexual Activity  Alcohol Use Yes   Alcohol/week: 10.0 standard drinks of alcohol   Types: 10 Cans of beer per week   Comment: was drinking 1-2 beers daily, last  drink was 10/13/18     Social History   Substance and Sexual Activity  Drug Use Yes   Frequency: 1.0 times per week   Types: Marijuana, Methamphetamines   Comment: once in last 4-5 days; before that 3 months ago; before that 2 years ago    Social History   Socioeconomic History   Marital status: Single    Spouse name: Not on file   Number of children: Not on file   Years of education: Not on file   Highest education level: Not on file  Occupational History   Occupation: Autobody work  Tobacco Use   Smoking status: Never   Smokeless tobacco: Current    Types: Chew   Tobacco comments:    dips  Vaping Use   Vaping status: Never Used  Substance and Sexual Activity   Alcohol use: Yes    Alcohol/week: 10.0 standard drinks of alcohol    Types: 10 Cans of beer per week    Comment: was drinking 1-2 beers daily, last drink was 10/13/18   Drug use: Yes    Frequency: 1.0 times per week    Types: Marijuana, Methamphetamines    Comment: once in last 4-5 days; before that 3 months ago; before that 2 years ago   Sexual activity: Yes    Birth control/protection: Condom  Other Topics Concern   Not on file  Social History Narrative   2025 - Lives with girlfriends and 71 year old son  Lives at home with father; stays with mother and sister on occasion; Takes supplement (C-4) prior to workouts      Dr. Sharene Skeans for neuro   Dr. Beverely Low PCP at [facility]   Social Drivers of Health   Financial Resource Strain: Not on file  Food Insecurity: No Food Insecurity (11/24/2023)   Hunger Vital Sign    Worried About Running Out of Food in the Last Year: Never true    Ran Out of Food in the Last Year: Never true  Transportation Needs: No Transportation Needs (11/24/2023)   PRAPARE - Administrator, Civil Service (Medical): No    Lack of Transportation (Non-Medical): No  Physical Activity: Not on file  Stress: Not on file  Social Connections: Not on file   Hospital  Course:  During the patient's hospitalization, patient had extensive initial psychiatric evaluation, and follow-up psychiatric evaluations every day.  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  -Increased Abilify to 10 mg po daily (starting tomorrow). The plan is to transition patient to the monthly injectable by discharge if he tolerates the oral dose. -Continue Hydroxyzine 25 mg po tid prn for anxiety. -Resumed on:  -Strattera 40 mg po daily for attention/concentration.  -Wellbutrin XL 150 mg po daily for depression.  -Increased Buspar 15 mg to tid for anxiety.    During the hospitalization, other adjustments were made to the patient's psychiatric medication regimen:  Abilify Maintena 400 mg LAI initiated yesterday q. 28 days Wellbutrin XL was increased to 300 mg p.o. daily for depression  Patient's care was discussed during the interdisciplinary team meeting every day during the hospitalization.  The patient denies having side effects to prescribed psychiatric medication.  Gradually, patient started adjusting to milieu. The patient was evaluated each day by a clinical provider to ascertain response to treatment. Improvement was noted by the patient's report of decreasing symptoms, improved sleep and appetite, affect, medication tolerance, behavior, and participation in unit programming.  Patient was asked each day to complete a self inventory noting mood, mental status, pain, new symptoms, anxiety and concerns.    Symptoms were reported as significantly decreased or resolved completely by discharge.   On day of discharge, the patient reports that their mood is stable. The patient denied having suicidal thoughts for more than 48 hours prior to discharge.  Patient denies having homicidal  thoughts.  Patient denies having auditory hallucinations.  Patient denies any visual hallucinations or other symptoms of psychosis. The patient was motivated to continue taking medication with a goal of continued improvement in mental health.   The patient reports their target psychiatric symptoms of psychosis responded well to the psychiatric medications, and the patient reports overall benefit other psychiatric hospitalization. Supportive psychotherapy was provided to the patient. The patient also participated in regular group therapy while hospitalized. Coping skills, problem solving as well as relaxation therapies were also part of the unit programming.  Labs were reviewed with the patient, and abnormal results were discussed with the patient.  The patient is able to verbalize their individual safety plan to this provider.  # It is recommended to the patient to continue psychiatric medications as prescribed, after discharge from the hospital.    # It is recommended to the patient to follow up with your outpatient psychiatric provider and PCP.  # It was  discussed with the patient, the impact of alcohol, drugs, tobacco have been there overall psychiatric and medical wellbeing, and total abstinence from substance use was recommended the patient.ed.  # Prescriptions provided or sent directly to preferred pharmacy at discharge. Patient agreeable to plan. Given opportunity to ask questions. Appears to feel comfortable with discharge.    # In the event of worsening symptoms, the patient is instructed to call the crisis hotline, 911 and or go to the nearest ED for appropriate evaluation and treatment of symptoms. To follow-up with primary care provider for other medical issues, concerns and or health care needs  # Patient was discharged to home with a plan to follow up as noted below.  Addendum: Regarding Abilify Maintena LAI 400 mg IM q. 28 days, this patient was instructed not to stop oral Abilify  until he is sure that subsequent LAI injection will be available and covered.  Last Abilify 400 mg LAI administered on 11/27/2023 at 1602.  Patient states he fully understands the instructions prior to discharge.  Physical Findings: AIMS:  , ,  ,  ,    CIWA:    COWS:     Musculoskeletal: Strength & Muscle Tone: within normal limits Gait & Station: normal Patient leans: N/A  Psychiatric Specialty Exam:  Presentation  General Appearance:  Appropriate for Environment; Casual  Eye Contact: Good  Speech: Clear and Coherent  Speech Volume: Normal  Handedness: Right  Mood and Affect  Mood: Euthymic  Affect: Appropriate; Congruent  Thought Process  Thought Processes: Coherent  Descriptions of Associations:Intact  Orientation:Full (Time, Place and Person)  Thought Content:Logical  History of Schizophrenia/Schizoaffective disorder:Yes  Duration of Psychotic Symptoms:Greater than six months  Hallucinations:Hallucinations: None Description of Auditory Hallucinations: Denies  Ideas of Reference:None  Suicidal Thoughts:Suicidal Thoughts: No  Homicidal Thoughts:Homicidal Thoughts: No  Sensorium  Memory: Immediate Good; Recent Good  Judgment: Fair  Insight: Fair  Art therapist  Concentration: Good  Attention Span: Good  Recall: Fair  Fund of Knowledge: Fair  Language: Good  Psychomotor Activity  Psychomotor Activity: Psychomotor Activity: Normal  Assets  Assets: Communication Skills; Desire for Improvement; Physical Health; Resilience  Sleep  Sleep: Sleep: Good Number of Hours of Sleep: 8.25  Physical Exam: Physical Exam Vitals and nursing note reviewed.  HENT:     Head: Normocephalic.     Nose: Nose normal.     Mouth/Throat:     Mouth: Mucous membranes are moist.     Pharynx: Oropharynx is clear.  Eyes:     Extraocular Movements: Extraocular movements intact.  Cardiovascular:     Rate and Rhythm: Normal rate.      Pulses: Normal pulses.  Pulmonary:     Effort: Pulmonary effort is normal.  Abdominal:     Comments: Deferred  Genitourinary:    Comments: Deferred Musculoskeletal:        General: Normal range of motion.     Cervical back: Normal range of motion.  Skin:    General: Skin is warm.  Neurological:     General: No focal deficit present.     Mental Status: He is alert and oriented to person, place, and time. Mental status is at baseline.  Psychiatric:        Mood and Affect: Mood normal.        Behavior: Behavior normal.        Thought Content: Thought content normal.    Review of Systems  Constitutional:  Negative for chills and fever.  HENT:  Negative for sore throat.   Eyes:  Negative for blurred vision.  Respiratory:  Negative for cough, sputum production, shortness of breath and wheezing.   Cardiovascular:  Negative for chest pain and palpitations.  Gastrointestinal:  Negative for abdominal pain, constipation, diarrhea, heartburn, nausea and vomiting.  Genitourinary:  Negative for dysuria, frequency and urgency.  Musculoskeletal:  Negative for back pain, myalgias and neck pain.  Skin:  Negative for itching and rash.  Neurological:  Negative for dizziness, tingling and headaches.  Endo/Heme/Allergies:        See allergy listing  Psychiatric/Behavioral:  Negative for depression, hallucinations and suicidal ideas. The patient is not nervous/anxious.    Blood pressure (!) 124/94, pulse 75, temperature 97.7 F (36.5 C), temperature source Oral, resp. rate 18, height 5\' 10"  (1.778 m), weight 96.2 kg, SpO2 100%. Body mass index is 30.42 kg/m.   Social History   Tobacco Use  Smoking Status Never  Smokeless Tobacco Current   Types: Chew  Tobacco Comments   dips   Tobacco Cessation:  A prescription for an FDA-approved tobacco cessation medication was offered at discharge and the patient refused   Blood Alcohol level:  Lab Results  Component Value Date   [facility] <10  11/10/2018    Metabolic Disorder Labs:  Lab Results  Component Value Date   HGBA1C 5.1 11/23/2023   MPG 99.67 11/23/2023   MPG 99.67 11/10/2018   Lab Results  Component Value Date   PROLACTIN 25.6 (H) 11/10/2018   Lab Results  Component Value Date   CHOL 166 11/23/2023   TRIG 89 11/23/2023   HDL 52 11/23/2023   CHOLHDL 3.2 11/23/2023   VLDL 18 11/23/2023   LDLCALC 96 11/23/2023   LDLCALC 94 02/19/2020    See Psychiatric Specialty Exam and Suicide Risk Assessment completed by Attending Physician prior to discharge.  Discharge destination:  Home  Is patient on multiple antipsychotic therapies at discharge:  No   Has Patient had three or more failed trials of antipsychotic monotherapy by history:  No  Recommended Plan for Multiple Antipsychotic Therapies: NA  Discharge Instructions     Diet - low sodium heart healthy   Complete by: As directed    Increase activity slowly   Complete by: As directed    Increase activity slowly   Complete by: As directed       Allergies as of 11/28/2023   No Known Allergies      Medication List     TAKE these medications      Indication  ARIPiprazole 10 MG tablet Commonly known as: ABILIFY Take 1 tablet (10 mg total) by mouth daily for 10 days. Start taking on: November 29, 2023  Indication: Schizophrenia, Mood control   ARIPiprazole ER 400 MG Srer injection Commonly known as: ABILIFY MAINTENA Inject 2 mLs (400 mg total) into the muscle every 28 (twenty-eight) days for 1 dose. Your next dose is due to be administered on 12-25-23. Start taking on: December 25, 2023  Indication: Schizophrenia   atomoxetine 40 MG capsule Commonly known as: STRATTERA Take 1 capsule (40 mg total) by mouth daily.  Indication: Attention Deficit Hyperactivity Disorder   buPROPion 300 MG 24 hr tablet Commonly known as: WELLBUTRIN XL Take 1 tablet (300 mg total) by mouth every morning.  Indication: Attention Deficit Hyperactivity Disorder,  Depression   busPIRone 15 MG tablet Commonly known as: BUSPAR Take 1 tablet (15 mg total) by mouth 3 (three) times daily. What changed: when to take this  Indication: Anxiety Disorder   hydrOXYzine 25 MG tablet Commonly known as: ATARAX Take 1 tablet (25 mg total) by mouth 3 (three) times daily as needed for up to 10 days for anxiety.  Indication: Feeling Anxious        Follow-up Information     Services, [facility]. Go on 11/30/2023.   Why: You have a hospital follow up appointment to obtain group therapy and medication management services on 11/30/23 at 9:00 am, in person. Contact information: [address] [place] [address] [phone]                Follow-up recommendations:   Discharge Recommendations:  The patient is being discharged to home. Patient is to take his discharge medications as ordered.  See follow up above. We recommend that he participates in individual therapy to target uncontrollable agitation and substance abuse.  We recommend that he participates in therapy to target personal conflict, to improve communication skills and conflict resolution skills. Patient is to initiate/implement a contingency based behavioral model to address his behavior. We recommend that he gets AIMS scale, height, weight, blood pressure, fasting lipid panel, fasting blood sugar in three months from discharge if he's on atypical antipsychotics.  Patient will benefit from monitoring of recurrent suicidal ideation since patient is on antidepressant medication. The patient should abstain from all illicit substances and alcohol. If the patient's symptoms worsen or do not continue to improve or if the patient becomes actively suicidal or homicidal then it is recommended that the patient return to the closest hospital emergency room or call 911 for further evaluation and treatment. National Suicide Prevention Lifeline 1800-SUICIDE or 515-453-8441. Please follow up with  your primary medical doctor for all other medical needs.  The patient has been educated on the possible side effects to medications and she/her guardian is to contact a medical professional and inform outpatient provider of any new side effects of medication. He is to take regular diet and activity as tolerated.  Will benefit from moderate daily exercise. Patient and Family was educated about removing/locking any firearms, medications or dangerous products from the home.  Activity:  As tolerated Diet:  Regular Diet  Signed: Cecilie Lowers, FNP 11/28/2023, 10:18 AM

## 2023-11-28 NOTE — Progress Notes (Signed)
  [facility] Adult Case Management Discharge Plan :  Will you be returning to the same living situation after discharge:  Yes,  pt will be returning home at discharge At discharge, do you have transportation home?: Yes,  pt will be picked up at 11:30AM by significant other Do you have the ability to pay for your medications: Yes,  pt is employed and states no barriers to medication  Release of information consent forms completed and in the chart;  Patient's signature needed at discharge.  Patient to Follow up at:  Follow-up Information     [facility], [facility]. Go on 11/30/2023.   Why: You have a hospital follow up appointment to obtain group therapy and medication management services on 11/30/23 at 9:00 am, in person. Contact information: [address] [place] [address] [phone]                 Next level of care provider has access to [facility] Link:no  Safety Planning and Suicide Prevention discussed: Yes,  C- gf, Radene Gunning [phone]     Has patient been referred to the Quitline?: Patient refused referral for treatment. Pt reports that he quit 3 months ago  Patient has been referred for addiction treatment: Patient refused referral for treatment. Pt not interested in substance use treatment  Kathi Der, LCSWA 11/28/2023, 9:28 AM

## 2023-12-01 NOTE — [facility] Group Notes (Addendum)
Spiritual care group on grief and loss facilitated by Chaplain Dyanne Carrel, [facility]  Group Goal: Support / Education around grief and loss  Members engage in facilitated group support and psycho-social education.  Group Description:  Following introductions and group rules, group members engaged in facilitated group dialogue and support around topic of loss, with particular support around experiences of loss in their lives. Group Identified types of loss (relationships / self / things) and identified patterns, circumstances, and changes that precipitate losses. Reflected on thoughts / feelings around loss, normalized grief responses, and recognized variety in grief experience. Group encouraged individual reflection on safe space and on the coping skills that they are already utilizing.  Group drew on Adlerian / Rogerian and narrative framework  Patient Progress: Gregg Crawford attended group and actively engaged and participated in group conversation and activities.
